# Patient Record
Sex: Female | Born: 1985 | Race: Black or African American | Hispanic: No | Marital: Single | Smoking: Never smoker
Health system: Southern US, Community
[De-identification: ages and names within clinical notes are randomized; demographics above are authoritative.]

## PROBLEM LIST (undated history)

## (undated) ENCOUNTER — Inpatient Hospital Stay (HOSPITAL_COMMUNITY): Payer: Self-pay

## (undated) DIAGNOSIS — Z8619 Personal history of other infectious and parasitic diseases: Secondary | ICD-10-CM

## (undated) DIAGNOSIS — E739 Lactose intolerance, unspecified: Secondary | ICD-10-CM

## (undated) DIAGNOSIS — D649 Anemia, unspecified: Secondary | ICD-10-CM

## (undated) DIAGNOSIS — R87629 Unspecified abnormal cytological findings in specimens from vagina: Secondary | ICD-10-CM

## (undated) DIAGNOSIS — R002 Palpitations: Secondary | ICD-10-CM

## (undated) DIAGNOSIS — F32A Depression, unspecified: Secondary | ICD-10-CM

## (undated) DIAGNOSIS — M549 Dorsalgia, unspecified: Secondary | ICD-10-CM

## (undated) DIAGNOSIS — R87619 Unspecified abnormal cytological findings in specimens from cervix uteri: Secondary | ICD-10-CM

## (undated) DIAGNOSIS — E559 Vitamin D deficiency, unspecified: Secondary | ICD-10-CM

## (undated) DIAGNOSIS — F419 Anxiety disorder, unspecified: Secondary | ICD-10-CM

## (undated) DIAGNOSIS — IMO0002 Reserved for concepts with insufficient information to code with codable children: Secondary | ICD-10-CM

## (undated) DIAGNOSIS — M7989 Other specified soft tissue disorders: Secondary | ICD-10-CM

## (undated) DIAGNOSIS — R0602 Shortness of breath: Secondary | ICD-10-CM

## (undated) HISTORY — DX: Dorsalgia, unspecified: M54.9

## (undated) HISTORY — DX: Palpitations: R00.2

## (undated) HISTORY — DX: Morbid (severe) obesity due to excess calories: E66.01

## (undated) HISTORY — DX: Other specified soft tissue disorders: M79.89

## (undated) HISTORY — DX: Unspecified abnormal cytological findings in specimens from cervix uteri: R87.619

## (undated) HISTORY — DX: Vitamin D deficiency, unspecified: E55.9

## (undated) HISTORY — DX: Anxiety disorder, unspecified: F41.9

## (undated) HISTORY — DX: Depression, unspecified: F32.A

## (undated) HISTORY — PX: NO PAST SURGERIES: SHX2092

## (undated) HISTORY — DX: Personal history of other infectious and parasitic diseases: Z86.19

## (undated) HISTORY — PX: INGUINAL HERNIA PEDIATRIC WITH LAPAROSCOPIC EXAM: SHX5643

## (undated) HISTORY — DX: Lactose intolerance, unspecified: E73.9

## (undated) HISTORY — PX: WISDOM TOOTH EXTRACTION: SHX21

## (undated) HISTORY — DX: Shortness of breath: R06.02

## (undated) HISTORY — DX: Reserved for concepts with insufficient information to code with codable children: IMO0002

---
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
## 2006-02-01 ENCOUNTER — Emergency Department (HOSPITAL_COMMUNITY): Admission: EM | Admit: 2006-02-01 | Discharge: 2006-02-01 | Payer: Self-pay | Admitting: Family Medicine

## 2006-08-11 ENCOUNTER — Emergency Department (HOSPITAL_COMMUNITY): Admission: EM | Admit: 2006-08-11 | Discharge: 2006-08-11 | Payer: Self-pay | Admitting: Family Medicine

## 2007-12-12 ENCOUNTER — Emergency Department (HOSPITAL_COMMUNITY): Admission: EM | Admit: 2007-12-12 | Discharge: 2007-12-12 | Payer: Self-pay | Admitting: Emergency Medicine

## 2008-08-06 ENCOUNTER — Inpatient Hospital Stay (HOSPITAL_COMMUNITY): Admission: AD | Admit: 2008-08-06 | Discharge: 2008-08-06 | Payer: Self-pay | Admitting: Obstetrics and Gynecology

## 2009-08-23 ENCOUNTER — Ambulatory Visit: Payer: Self-pay | Admitting: Obstetrics & Gynecology

## 2010-01-13 NOTE — L&D Delivery Note (Signed)
Delivery Note Started pushing w/ pt around 0015.  Fhr 135-140s at onset.  Pitocin on 14mu.  Pt felt warm at onset of pushing, and did spike temp=100.4 during pushing stage.  Pt received IVF bolus and po Tylenol.  While pushing, fhr w/ intermittent moderate variables, and at times baseline 150s to 160s.  Pushed left and right tilted positions.  Pt w/ moderate rectal pressure while pushing.  Sister and 2 other females present for delivery.  At 1:36 AM a viable female "Elijah" was delivered via Vaginal, Spontaneous Delivery (Presentation: Left Occiput Anterior).  APGAR: 8, 9; weight 8 lb 12 oz (3970 g).  LNC x1 noted at delivery and reduced over shoulders and body w/o difficulty.  Newborn placed immediately on mom's abdomen; slow to cry, but transitioned well w/ drying and stimulation. Cord doubly clamped and cut soon after delivery secondary to anticipating further interventions at the warmer, but not necessary; cut by patient.   Placenta status: Intact, Spontaneous, Schultz.  Cord: 3 vessels with the following complications: None.  Cord pH: n/a End second stage meconium noted as newborn delivered; fluid had been clear during labor. Anesthesia: Epidural  Episiotomy: None Lacerations: 1st degree;Sulcus; bilateral; Rt repaired, left hemostatic; Rt slightly deeper. Suture Repair: 3-0 monocryl Est. Blood Loss (mL): Secondary to mom's fever intrapartally, and bleeding heavy after placenta, given pr cytotec. Mom to postpartum.  Baby to nursery-stable. Pt plans to breastfeed and outpatient circ.   CTO temp closely. Jalesia Loudenslager H 12/24/2010, 3:08 AM

## 2010-04-08 ENCOUNTER — Inpatient Hospital Stay (HOSPITAL_COMMUNITY): Payer: Medicaid Other

## 2010-04-08 ENCOUNTER — Inpatient Hospital Stay (HOSPITAL_COMMUNITY)
Admission: AD | Admit: 2010-04-08 | Discharge: 2010-04-08 | Disposition: A | Discharge status: Home / Self Care | Payer: Medicaid Other | Source: Ambulatory Visit | Attending: Obstetrics & Gynecology | Admitting: Obstetrics & Gynecology

## 2010-04-08 DIAGNOSIS — A499 Bacterial infection, unspecified: Secondary | ICD-10-CM

## 2010-04-08 DIAGNOSIS — R109 Unspecified abdominal pain: Secondary | ICD-10-CM | POA: Insufficient documentation

## 2010-04-08 DIAGNOSIS — O2390 Unspecified genitourinary tract infection in pregnancy, unspecified trimester: Secondary | ICD-10-CM

## 2010-04-08 DIAGNOSIS — N760 Acute vaginitis: Secondary | ICD-10-CM | POA: Insufficient documentation

## 2010-04-08 DIAGNOSIS — B9689 Other specified bacterial agents as the cause of diseases classified elsewhere: Secondary | ICD-10-CM | POA: Insufficient documentation

## 2010-04-08 LAB — CBC
HCT: 38.6 % (ref 36.0–46.0)
Hemoglobin: 12.9 g/dL (ref 12.0–15.0)
MCH: 30.6 pg (ref 26.0–34.0)
MCHC: 33.4 g/dL (ref 30.0–36.0)
MCV: 91.5 fL (ref 78.0–100.0)

## 2010-04-08 LAB — URINALYSIS, ROUTINE W REFLEX MICROSCOPIC
Bilirubin Urine: NEGATIVE
Glucose, UA: NEGATIVE mg/dL
Hgb urine dipstick: NEGATIVE
Ketones, ur: NEGATIVE mg/dL
Nitrite: NEGATIVE
Protein, ur: NEGATIVE mg/dL
Specific Gravity, Urine: 1.02 (ref 1.005–1.030)
Urobilinogen, UA: 4 mg/dL — ABNORMAL HIGH (ref 0.0–1.0)
pH: 7 (ref 5.0–8.0)

## 2010-04-08 LAB — HCG, QUANTITATIVE, PREGNANCY: hCG, Beta Chain, Quant, S: 625 m[IU]/mL — ABNORMAL HIGH (ref <5)

## 2010-04-08 LAB — ABO/RH: ABO/RH(D): A POS

## 2010-04-08 LAB — URINE MICROSCOPIC-ADD ON

## 2010-04-08 LAB — WET PREP, GENITAL

## 2010-04-08 LAB — POCT PREGNANCY, URINE: Preg Test, Ur: POSITIVE

## 2010-04-09 LAB — GC/CHLAMYDIA PROBE AMP, GENITAL
Chlamydia, DNA Probe: NEGATIVE
GC Probe Amp, Genital: NEGATIVE

## 2010-04-10 ENCOUNTER — Inpatient Hospital Stay (HOSPITAL_COMMUNITY)
Admission: AD | Admit: 2010-04-10 | Discharge: 2010-04-10 | Disposition: A | Discharge status: Home / Self Care | Payer: Medicaid Other | Source: Ambulatory Visit | Attending: Obstetrics and Gynecology | Admitting: Obstetrics and Gynecology

## 2010-04-10 ENCOUNTER — Inpatient Hospital Stay (HOSPITAL_COMMUNITY): Payer: Medicaid Other

## 2010-04-10 DIAGNOSIS — O3680X0 Pregnancy with inconclusive fetal viability, not applicable or unspecified: Secondary | ICD-10-CM

## 2010-04-10 DIAGNOSIS — O99891 Other specified diseases and conditions complicating pregnancy: Secondary | ICD-10-CM | POA: Insufficient documentation

## 2010-04-10 LAB — HCG, QUANTITATIVE, PREGNANCY: hCG, Beta Chain, Quant, S: 2380 m[IU]/mL — ABNORMAL HIGH (ref <5)

## 2010-04-17 ENCOUNTER — Encounter (HOSPITAL_COMMUNITY): Payer: Self-pay

## 2010-04-17 ENCOUNTER — Ambulatory Visit (HOSPITAL_COMMUNITY)
Admit: 2010-04-17 | Discharge: 2010-04-17 | Disposition: A | Discharge status: Home / Self Care | Payer: Medicaid Other | Attending: Obstetrics and Gynecology | Admitting: Obstetrics and Gynecology

## 2010-04-17 DIAGNOSIS — Z3689 Encounter for other specified antenatal screening: Secondary | ICD-10-CM | POA: Insufficient documentation

## 2010-04-17 DIAGNOSIS — O99891 Other specified diseases and conditions complicating pregnancy: Secondary | ICD-10-CM | POA: Insufficient documentation

## 2010-04-17 DIAGNOSIS — R109 Unspecified abdominal pain: Secondary | ICD-10-CM | POA: Insufficient documentation

## 2010-04-21 LAB — POCT PREGNANCY, URINE: Preg Test, Ur: NEGATIVE

## 2010-04-25 ENCOUNTER — Inpatient Hospital Stay (HOSPITAL_COMMUNITY)
Admission: AD | Admit: 2010-04-25 | Discharge: 2010-04-25 | Disposition: A | Discharge status: Home / Self Care | Payer: Medicaid Other | Source: Ambulatory Visit | Attending: Obstetrics & Gynecology | Admitting: Obstetrics & Gynecology

## 2010-04-25 DIAGNOSIS — B373 Candidiasis of vulva and vagina: Secondary | ICD-10-CM

## 2010-04-25 DIAGNOSIS — N949 Unspecified condition associated with female genital organs and menstrual cycle: Secondary | ICD-10-CM | POA: Insufficient documentation

## 2010-04-25 DIAGNOSIS — B3731 Acute candidiasis of vulva and vagina: Secondary | ICD-10-CM | POA: Insufficient documentation

## 2010-04-25 DIAGNOSIS — O2390 Unspecified genitourinary tract infection in pregnancy, unspecified trimester: Secondary | ICD-10-CM

## 2010-04-25 LAB — WET PREP, GENITAL
Clue Cells Wet Prep HPF POC: NONE SEEN
Trich, Wet Prep: NONE SEEN

## 2010-05-14 LAB — RUBELLA ANTIBODY, IGM: Rubella: IMMUNE

## 2010-05-14 LAB — HEPATITIS B SURFACE ANTIGEN: Hepatitis B Surface Ag: NEGATIVE

## 2010-05-14 LAB — ANTIBODY SCREEN: Antibody Screen: NEGATIVE

## 2010-05-14 LAB — HIV ANTIBODY (ROUTINE TESTING W REFLEX): HIV: NONREACTIVE

## 2010-08-13 ENCOUNTER — Other Ambulatory Visit (HOSPITAL_COMMUNITY): Payer: Self-pay | Admitting: Obstetrics and Gynecology

## 2010-08-13 DIAGNOSIS — O359XX0 Maternal care for (suspected) fetal abnormality and damage, unspecified, not applicable or unspecified: Secondary | ICD-10-CM

## 2010-08-15 ENCOUNTER — Encounter (HOSPITAL_COMMUNITY): Payer: Self-pay

## 2010-08-15 ENCOUNTER — Ambulatory Visit (HOSPITAL_COMMUNITY)
Admission: RE | Admit: 2010-08-15 | Discharge: 2010-08-15 | Disposition: A | Discharge status: Home / Self Care | Payer: Medicaid Other | Source: Ambulatory Visit | Attending: Obstetrics and Gynecology | Admitting: Obstetrics and Gynecology

## 2010-08-15 VITALS — BP 121/62 | HR 76 | Wt 226.0 lb

## 2010-08-15 DIAGNOSIS — O358XX0 Maternal care for other (suspected) fetal abnormality and damage, not applicable or unspecified: Secondary | ICD-10-CM | POA: Insufficient documentation

## 2010-08-15 DIAGNOSIS — Z363 Encounter for antenatal screening for malformations: Secondary | ICD-10-CM | POA: Insufficient documentation

## 2010-08-15 DIAGNOSIS — O359XX0 Maternal care for (suspected) fetal abnormality and damage, unspecified, not applicable or unspecified: Secondary | ICD-10-CM

## 2010-08-15 DIAGNOSIS — Z1389 Encounter for screening for other disorder: Secondary | ICD-10-CM | POA: Insufficient documentation

## 2010-08-15 NOTE — Progress Notes (Signed)
Report in AS/EPIC; follow-up as needed 

## 2010-10-15 LAB — POCT I-STAT, CHEM 8
BUN: 8
Creatinine, Ser: 1.1
Glucose, Bld: 99
Hemoglobin: 13.9
TCO2: 23

## 2010-10-15 LAB — URINALYSIS, ROUTINE W REFLEX MICROSCOPIC
Bilirubin Urine: NEGATIVE
Glucose, UA: NEGATIVE
Hgb urine dipstick: NEGATIVE
Protein, ur: 30 — AB
Specific Gravity, Urine: 1.027
Urobilinogen, UA: 1

## 2010-10-15 LAB — URINE MICROSCOPIC-ADD ON

## 2010-10-15 LAB — GLUCOSE, CAPILLARY: Glucose-Capillary: 87

## 2010-10-28 LAB — POCT RAPID STREP A: Streptococcus, Group A Screen (Direct): NEGATIVE

## 2010-11-21 ENCOUNTER — Encounter (HOSPITAL_COMMUNITY): Payer: Self-pay | Admitting: *Deleted

## 2010-11-21 ENCOUNTER — Inpatient Hospital Stay (HOSPITAL_COMMUNITY)
Admission: AD | Admit: 2010-11-21 | Discharge: 2010-11-22 | Disposition: A | Discharge status: Home / Self Care | Payer: Medicaid Other | Source: Ambulatory Visit | Attending: Obstetrics and Gynecology | Admitting: Obstetrics and Gynecology

## 2010-11-21 DIAGNOSIS — O99891 Other specified diseases and conditions complicating pregnancy: Secondary | ICD-10-CM | POA: Insufficient documentation

## 2010-11-21 DIAGNOSIS — N949 Unspecified condition associated with female genital organs and menstrual cycle: Secondary | ICD-10-CM | POA: Insufficient documentation

## 2010-11-22 LAB — URINALYSIS, ROUTINE W REFLEX MICROSCOPIC
Bilirubin Urine: NEGATIVE
Glucose, UA: NEGATIVE mg/dL
Ketones, ur: 15 mg/dL — AB
Protein, ur: NEGATIVE mg/dL

## 2010-11-22 LAB — URINE MICROSCOPIC-ADD ON

## 2010-11-22 MED ORDER — NITROFURANTOIN MONOHYD MACRO 100 MG PO CAPS: 100.0000 mg | ORAL_CAPSULE | Freq: Two times a day (BID) | ORAL | Status: AC

## 2010-11-22 MED ORDER — HYDROXYZINE PAMOATE 50 MG PO CAPS: 50.0000 mg | ORAL_CAPSULE | Freq: Four times a day (QID) | ORAL | Status: AC | PRN

## 2010-11-22 NOTE — ED Provider Notes (Signed)
History     Chief Complaint  Patient presents with  . Pelvic Pain   HPI Comments: Pt is a G1P0 at [redacted]w[redacted]d that arrived unannounced with CC of pelvic pain and pressure that was making it difficult to walk. She denies ctx, VB or LOF, +FM. She said she the pain has been getting worse over the last few days. She said she tried to lay down after work, and take some tylenol and she had some relief.  Declines need for pani medication at present. States she does have difficulty sleeping.  She denies HA/N/V or RUQ pain   Pelvic Pain The patient's primary symptoms include pelvic pain. This is a chronic problem. The current episode started in the past 7 days. The problem occurs constantly. The problem has been unchanged. The pain is mild. The problem affects both sides. She is pregnant.    No past medical history on file.  Past Surgical History  Procedure Date  . Hernia repair   . No past surgeries     Family History  Problem Relation Age of Onset  . Hypertension Father   . Diabetes Father     History  Substance Use Topics  . Smoking status: Never Smoker   . Smokeless tobacco: Never Used  . Alcohol Use: No    Allergies:  Allergies  Allergen Reactions  . Latex Itching    Prescriptions prior to admission  Medication Sig Dispense Refill  . acetaminophen (TYLENOL) 500 MG tablet Take 500 mg by mouth every 6 (six) hours as needed. For pain       . prenatal vitamin w/FE, FA (PRENATAL 1 + 1) 27-1 MG TABS Take 1 tablet by mouth daily.        Marland Kitchen DISCONTD: PRENATAL VITAMINS PO Take by mouth.          Review of Systems  Genitourinary: Positive for pelvic pain.  All other systems reviewed and are negative.   Physical Exam   Blood pressure 112/53, pulse 82, temperature 98.7 F (37.1 C), temperature source Oral, resp. rate 20, height 5\' 7"  (1.702 m), weight 108.863 kg (240 lb), last menstrual period 03/09/2010.  Physical Exam  Nursing note and vitals reviewed. Constitutional: She is  oriented to person, place, and time. She appears well-developed and well-nourished.  Neck: Normal range of motion.  Cardiovascular: Normal rate.   Respiratory: Effort normal.  GI: Soft.  Genitourinary: Vagina normal.       Cx=1/th/high ballottable   Musculoskeletal: Normal range of motion. She exhibits edema.       1+ to 2+ ankle and pedal, hands slightly swolen  Neurological: She is alert and oriented to person, place, and time. She has normal reflexes.  Skin: Skin is warm and dry.  Psychiatric: She has a normal mood and affect. Her behavior is normal.   FHR 140 Mod variability Cat I toco irritability rare ctx   MAU Course  Procedures    Assessment and Plan  IUP at [redacted]w[redacted]d No labor FHR reassuring Normal preg discomfort Initially elevated BP, repeat normal - no PIH sx's  D/C home,  RX for vistaril PO for rest RTO as scheduled  Rhonda Barron M 11/22/2010, 12:17 AM

## 2010-12-19 ENCOUNTER — Telehealth (HOSPITAL_COMMUNITY): Payer: Self-pay | Admitting: *Deleted

## 2010-12-19 ENCOUNTER — Encounter (HOSPITAL_COMMUNITY): Payer: Self-pay | Admitting: *Deleted

## 2010-12-19 NOTE — Telephone Encounter (Signed)
Preadmission screen  

## 2010-12-21 ENCOUNTER — Encounter (HOSPITAL_COMMUNITY): Payer: Self-pay | Admitting: Pharmacist

## 2010-12-22 ENCOUNTER — Encounter (HOSPITAL_COMMUNITY): Payer: Self-pay

## 2010-12-22 ENCOUNTER — Inpatient Hospital Stay (HOSPITAL_COMMUNITY)
Admission: RE | Admit: 2010-12-22 | Discharge: 2010-12-26 | DRG: 774 | Disposition: A | Discharge status: Home / Self Care | Payer: Medicaid Other | Source: Ambulatory Visit | Attending: Obstetrics and Gynecology | Admitting: Obstetrics and Gynecology

## 2010-12-22 DIAGNOSIS — A630 Anogenital (venereal) warts: Secondary | ICD-10-CM | POA: Diagnosis present

## 2010-12-22 DIAGNOSIS — O480 Post-term pregnancy: Principal | ICD-10-CM | POA: Diagnosis present

## 2010-12-22 DIAGNOSIS — O98519 Other viral diseases complicating pregnancy, unspecified trimester: Secondary | ICD-10-CM | POA: Diagnosis present

## 2010-12-22 LAB — CBC
MCV: 90.6 fL (ref 78.0–100.0)
Platelets: 191 10*3/uL (ref 150–400)
RBC: 3.93 MIL/uL (ref 3.87–5.11)
RDW: 13.5 % (ref 11.5–15.5)
WBC: 7.3 10*3/uL (ref 4.0–10.5)

## 2010-12-22 LAB — RPR: RPR Ser Ql: NONREACTIVE

## 2010-12-22 MED ORDER — ONDANSETRON HCL 4 MG/2ML IJ SOLN
4.0000 mg | Freq: Four times a day (QID) | INTRAMUSCULAR | Status: DC | PRN
  Administered 2010-12-23: 4 mg via INTRAVENOUS
  Filled 2010-12-22: qty 2

## 2010-12-22 MED ORDER — BUTORPHANOL TARTRATE 2 MG/ML IJ SOLN
1.0000 mg | INTRAMUSCULAR | Status: DC | PRN
  Administered 2010-12-23: 1 mg via INTRAVENOUS
  Filled 2010-12-22 (×2): qty 1

## 2010-12-22 MED ORDER — MISOPROSTOL 25 MCG QUARTER TABLET
25.0000 ug | ORAL_TABLET | ORAL | Status: DC | PRN
  Administered 2010-12-22 (×3): 25 ug via VAGINAL
  Filled 2010-12-22 (×4): qty 0.25

## 2010-12-22 MED ORDER — HYDROXYZINE HCL 50 MG/ML IM SOLN: 50.0000 mg | Freq: Four times a day (QID) | INTRAMUSCULAR | Status: DC | PRN

## 2010-12-22 MED ORDER — SODIUM CHLORIDE 0.9 % IJ SOLN
3.0000 mL | Freq: Two times a day (BID) | INTRAMUSCULAR | Status: DC
  Administered 2010-12-22: 3 mL via INTRAVENOUS

## 2010-12-22 MED ORDER — CITRIC ACID-SODIUM CITRATE 334-500 MG/5ML PO SOLN
30.0000 mL | ORAL | Status: DC | PRN
  Filled 2010-12-22: qty 15

## 2010-12-22 MED ORDER — LACTATED RINGERS IV SOLN
500.0000 mL | INTRAVENOUS | Status: DC | PRN
  Administered 2010-12-23 (×3): 1000 mL via INTRAVENOUS

## 2010-12-22 MED ORDER — HYDROXYZINE HCL 50 MG PO TABS: 50.0000 mg | ORAL_TABLET | Freq: Four times a day (QID) | ORAL | Status: DC | PRN

## 2010-12-22 MED ORDER — ZOLPIDEM TARTRATE 10 MG PO TABS: 10.0000 mg | ORAL_TABLET | Freq: Every evening | ORAL | Status: DC | PRN

## 2010-12-22 MED ORDER — OXYCODONE-ACETAMINOPHEN 5-325 MG PO TABS: 2.0000 | ORAL_TABLET | ORAL | Status: DC | PRN

## 2010-12-22 MED ORDER — SODIUM CHLORIDE 0.9 % IV SOLN: 250.0000 mL | INTRAVENOUS | Status: DC | PRN

## 2010-12-22 MED ORDER — IBUPROFEN 600 MG PO TABS: 600.0000 mg | ORAL_TABLET | Freq: Four times a day (QID) | ORAL | Status: DC | PRN

## 2010-12-22 MED ORDER — DINOPROSTONE 10 MG VA INST
10.0000 mg | VAGINAL_INSERT | Freq: Once | VAGINAL | Status: AC
  Administered 2010-12-22: 10 mg via VAGINAL
  Filled 2010-12-22: qty 1

## 2010-12-22 MED ORDER — SODIUM CHLORIDE 0.9 % IJ SOLN: 3.0000 mL | INTRAMUSCULAR | Status: DC | PRN

## 2010-12-22 MED ORDER — LIDOCAINE HCL (PF) 1 % IJ SOLN
30.0000 mL | INTRAMUSCULAR | Status: DC | PRN
  Filled 2010-12-22: qty 30

## 2010-12-22 MED ORDER — ACETAMINOPHEN 325 MG PO TABS
650.0000 mg | ORAL_TABLET | ORAL | Status: DC | PRN
  Administered 2010-12-23 – 2010-12-24 (×2): 650 mg via ORAL
  Filled 2010-12-22 (×2): qty 2

## 2010-12-22 MED ORDER — OXYTOCIN 20 UNITS IN LACTATED RINGERS INFUSION - SIMPLE
125.0000 mL/h | Freq: Once | INTRAVENOUS | Status: AC
  Administered 2010-12-24: 999 mL/h via INTRAVENOUS

## 2010-12-22 MED ORDER — FLEET ENEMA 7-19 GM/118ML RE ENEM: 1.0000 | ENEMA | RECTAL | Status: DC | PRN

## 2010-12-22 MED ORDER — OXYTOCIN 20 UNITS IN LACTATED RINGERS INFUSION - SIMPLE
1.0000 m[IU]/min | INTRAVENOUS | Status: DC
  Administered 2010-12-23: 1 m[IU]/min via INTRAVENOUS
  Administered 2010-12-23: 12 m[IU]/min via INTRAVENOUS
  Filled 2010-12-22: qty 1000

## 2010-12-22 MED ORDER — OXYTOCIN BOLUS FROM INFUSION
500.0000 mL | Freq: Once | INTRAVENOUS | Status: DC
  Filled 2010-12-22: qty 500

## 2010-12-22 MED ORDER — TERBUTALINE SULFATE 1 MG/ML IJ SOLN: 0.2500 mg | Freq: Once | INTRAMUSCULAR | Status: AC | PRN

## 2010-12-22 NOTE — Progress Notes (Signed)
Subjective: Placed pt's 3rd cytotec pv around 1630.  No c/o's except "cramping" sister and another female visitor remain at bedside.  No LOF or VB.  GFM.  Objective: BP 136/71  Pulse 75  Temp(Src) 98.2 F (36.8 C) (Oral)  Resp 18  Ht 5\' 7"  (1.702 m)  Wt 113.399 kg (250 lb)  BMI 39.16 kg/m2  LMP 03/09/2010      FHT:  FHR: 145 bpm, variability: moderate,  accelerations:  Present,  decelerations:  Absent UC:   Uterine irratability SVE:   Dilation: 1.5 (cervix anterior and to the left) Effacement (%): 60 Station: -2 Exam by:: H.Juniel Groene,cnm  Labs: Lab Results  Component Value Date   WBC 7.3 12/22/2010   HGB 12.4 12/22/2010   HCT 35.6* 12/22/2010   MCV 90.6 12/22/2010   PLT 191 12/22/2010    Assessment / Plan: Induction at 41 weeks  Labor: s/p 3rd dose pv cytotec at 1630 Preeclampsia:  no signs or symptoms of toxicity Fetal Wellbeing:  Category I Pain Control:  Labor support without medications I/D:  n/a Anticipated MOD:  NSVD Will reassess at 2030; will switch to cervidil overnight at that point if no active labor. Coleman Kalas H 12/22/2010, 4:57 PM

## 2010-12-22 NOTE — Progress Notes (Signed)
Monitors off. 

## 2010-12-22 NOTE — Progress Notes (Signed)
Subjective: Pt w/o complaints; cramping continues, mild.  Had supper.  No LOF or VB.  GFM.  S/p 3 does of pv cytotec.  Objective: BP 121/74  Pulse 88  Temp(Src) 97.7 F (36.5 C) (Axillary)  Resp 18  Ht 5\' 7"  (1.702 m)  Wt 113.399 kg (250 lb)  BMI 39.16 kg/m2  LMP 03/09/2010   Total I/O In: 2 [I.V.:2] Out: -   FHT:  FHR: 150 bpm, variability: moderate,  accelerations:  Present,  decelerations:  Absent UC:   1-3 minutes, primarily UI SVE:   Dilation: 1.5 Effacement (%): 70;80 Station: -2 Exam by:: Gabriana Wilmott  Labs: Lab Results  Component Value Date   WBC 7.3 12/22/2010   HGB 12.4 12/22/2010   HCT 35.6* 12/22/2010   MCV 90.6 12/22/2010   PLT 191 12/22/2010    Assessment / Plan: Induction at 41 weeks; s/p 3 doses pv cytotec  Labor: No dilatation progress, but cx has thinned some Preeclampsia:  no signs or symptoms of toxicity Fetal Wellbeing:  Category I Pain Control:  Labor support without medications I/D:  n/a Anticipated MOD:  NSVD Cervidil placed at 2035.  Will watch for 2 hr, and then do NST q4hrs overnight.  Rec'd rest, Ambien prn.  Will start Pitocin in AM.  C/w MD prn. Yousra Ivens H 12/22/2010, 11:47 PM

## 2010-12-23 ENCOUNTER — Encounter (HOSPITAL_COMMUNITY): Payer: Self-pay | Admitting: Anesthesiology

## 2010-12-23 ENCOUNTER — Inpatient Hospital Stay (HOSPITAL_COMMUNITY): Payer: Medicaid Other | Admitting: Anesthesiology

## 2010-12-23 DIAGNOSIS — Z87898 Personal history of other specified conditions: Secondary | ICD-10-CM | POA: Insufficient documentation

## 2010-12-23 DIAGNOSIS — O480 Post-term pregnancy: Secondary | ICD-10-CM | POA: Diagnosis present

## 2010-12-23 DIAGNOSIS — E669 Obesity, unspecified: Secondary | ICD-10-CM | POA: Insufficient documentation

## 2010-12-23 MED ORDER — FENTANYL 2.5 MCG/ML BUPIVACAINE 1/10 % EPIDURAL INFUSION (WH - ANES)
14.0000 mL/h | INTRAMUSCULAR | Status: DC
  Administered 2010-12-23 (×2): 14 mL/h via EPIDURAL
  Filled 2010-12-23 (×3): qty 60

## 2010-12-23 MED ORDER — FENTANYL 2.5 MCG/ML BUPIVACAINE 1/10 % EPIDURAL INFUSION (WH - ANES)
INTRAMUSCULAR | Status: DC | PRN
  Administered 2010-12-23: 14 mL/h via EPIDURAL

## 2010-12-23 MED ORDER — EPHEDRINE 5 MG/ML INJ: 10.0000 mg | INTRAVENOUS | Status: DC | PRN

## 2010-12-23 MED ORDER — LIDOCAINE HCL 1.5 % IJ SOLN
INTRAMUSCULAR | Status: DC | PRN
  Administered 2010-12-23 (×2): 4 mL via EPIDURAL

## 2010-12-23 MED ORDER — DIPHENHYDRAMINE HCL 50 MG/ML IJ SOLN: 12.5000 mg | INTRAMUSCULAR | Status: DC | PRN

## 2010-12-23 MED ORDER — EPHEDRINE 5 MG/ML INJ
10.0000 mg | INTRAVENOUS | Status: DC | PRN
  Filled 2010-12-23: qty 4

## 2010-12-23 MED ORDER — PHENYLEPHRINE 40 MCG/ML (10ML) SYRINGE FOR IV PUSH (FOR BLOOD PRESSURE SUPPORT): 80.0000 ug | PREFILLED_SYRINGE | INTRAVENOUS | Status: DC | PRN

## 2010-12-23 MED ORDER — LACTATED RINGERS IV SOLN: 500.0000 mL | Freq: Once | INTRAVENOUS | Status: DC

## 2010-12-23 MED ORDER — PHENYLEPHRINE 40 MCG/ML (10ML) SYRINGE FOR IV PUSH (FOR BLOOD PRESSURE SUPPORT)
80.0000 ug | PREFILLED_SYRINGE | INTRAVENOUS | Status: DC | PRN
  Filled 2010-12-23: qty 5

## 2010-12-23 NOTE — H&P (Signed)
Rhonda Barron is a 25 y.o. black female presenting at 41 weeks for term induction of labor.  Pt w/o c/o's this morning.  Discussed induction agents and risk of c/s with induction; pt desires to proceed despite increased risk w/ induction and primagravida.  Accompanied to [facility] by her sister.  No LOF, VB, or abnl d/c.  GFM.  Occ'l ctx.  No UTI or PIH s/s.  Pt did not receive flu vaccine during pregnancy. Onset of care at 9.2 weeks.  Most recent u/s at 38 weeks and growth in 78%.   Maternal Medical History:  Reason for admission: Post term induction of labor  Contractions: Frequency: rare.   Perceived severity is mild.    Fetal activity: Perceived fetal activity is normal.   Last perceived fetal movement was within the past hour.    Prenatal complications: 1.  H/o chlamydia 2.  LSIL at NOB; repeat at 28 weeks ASCUS w/ HR HPV 3.  Treatment of genital warts during pregnancy w/ TCA (several times during 2nd trimester) 4.  MFM referral for detailed anatomy after initial anatomy scan showed questionable anechoic area of fetal bowel; Scan by Dr. Tona Sensing revealed nml findings and no fetal anomalies 5.  Obese    OB History    Grav Para Term Preterm Abortions TAB SAB Ect Mult Living   1 0 0 0 0 0 0 0 0 0      Past Medical History  Diagnosis Date  . History of chlamydia   . Abnormal Pap smear    Past Surgical History  Procedure Date  . Hernia repair   . No past surgeries    Family History: family history includes Asthma in her brother; Diabetes in her father; Hypertension in her father; and Stroke in her paternal uncle.  There is no history of Anesthesia problems, and Hypotension, and Pseudochol deficiency, and Malignant hyperthermia, . Social History:  reports that she has never smoked. She has never used smokeless tobacco. She reports that she does not drink alcohol or use illicit drugs.  Review of Systems  Constitutional: Negative.   HENT: Negative.   Eyes: Negative.   Respiratory:  Negative.   Cardiovascular: Negative.   Gastrointestinal: Negative.   Genitourinary: Negative.   Skin: Negative.   Neurological: Negative.    Blood pressure 132/81, pulse 77, temperature 98.2 F (36.8 C), temperature source Oral, resp. rate 20, height 5\' 7"  (1.702 m), weight 113.399 kg (250 lb), last menstrual period 03/09/2010. Maternal Exam:  Uterine Assessment: Contraction strength is mild.  Contraction frequency is rare.   Abdomen: Patient reports no abdominal tenderness. Fundal height is 8-8.5 lbs.   Fetal presentation: vertex  Introitus: not evaluated.   Pelvis: adequate for delivery.   Cervix: Cervix evaluated by digital exam.     Fetal Exam Fetal Monitor Review: Mode: ultrasound.   Baseline rate: 145.  Variability: moderate (6-25 bpm).   Pattern: accelerations present and no decelerations.    Fetal State Assessment: Category I - tracings are normal.     Physical Exam  Constitutional: She is oriented to person, place, and time. She appears well-developed and well-nourished. No distress.  Cardiovascular: Normal rate and regular rhythm.   Respiratory: Effort normal and breath sounds normal.  GI: Soft. Bowel sounds are normal.       gravid  Genitourinary:       Cx:  Loose 1cm/50/-2, anterior, difficulty reaching secondary to position just behind pubic bone  Musculoskeletal: She exhibits edema.  2+ BLE pitting edema; DTR's 1+ bilaterally; no clonus  Neurological: She is alert and oriented to person, place, and time. She has normal reflexes.  Skin: Skin is warm and dry.  Psychiatric: She has a normal mood and affect. Her behavior is normal. Thought content normal.    Prenatal labs: ABO, Rh: --/--/A POS (03/26 1100) Antibody: Negative (05/01 0000) Rubella: Immune (05/01 0000) RPR: NON REACTIVE (12/09 0800)  HBsAg: Negative (05/01 0000)  HIV: Non-reactive (05/01 0000)  GBS: Negative (11/07 0000)  1st trimester screen and AFP Negative  Assessment/Plan: 1.   IUP at 41 weeks 2.  Nonfavorable cx, but post-term and desires IOL 3.  Cat I FHT 4.  GBS neg 5.  H/o abnl pap x 2 in pregnancy 6.  Condylomata treatments during 2nd trimester  1.  Admit to BS w/ Routine CNM orders 2.  Will proceed w/ cytotec pv q 4hr during day, and if no labor, consider continuing overnight, or switch to cervidil 3.  Regular diet until in active labor 4.  NST q 2 hrs  5.  C/w MD prn   STEELMAN,CANDICE H 12/23/2010, 12:31 PM

## 2010-12-23 NOTE — Anesthesia Preprocedure Evaluation (Signed)
Anesthesia Evaluation  Patient identified by MRN, date of birth, ID band Patient awake    Reviewed: Allergy & Precautions, H&P , Patient's Chart, lab work & pertinent test results  Airway Mallampati: III TM Distance: >3 FB Neck ROM: full    Dental No notable dental hx. (+) Teeth Intact   Pulmonary neg pulmonary ROS,  clear to auscultation  Pulmonary exam normal       Cardiovascular neg cardio ROS regular Normal    Neuro/Psych Negative Neurological ROS  Negative Psych ROS   GI/Hepatic negative GI ROS, Neg liver ROS,   Endo/Other  Negative Endocrine ROSMorbid obesity  Renal/GU negative Renal ROS  Genitourinary negative   Musculoskeletal   Abdominal Normal abdominal exam  (+)   Peds  Hematology negative hematology ROS (+)   Anesthesia Other Findings   Reproductive/Obstetrics (+) Pregnancy                           Anesthesia Physical Anesthesia Plan  ASA: III  Anesthesia Plan: Epidural   Post-op Pain Management:    Induction:   Airway Management Planned:   Additional Equipment:   Intra-op Plan:   Post-operative Plan:   Informed Consent: I have reviewed the patients History and Physical, chart, labs and discussed the procedure including the risks, benefits and alternatives for the proposed anesthesia with the patient or authorized representative who has indicated his/her understanding and acceptance.     Plan Discussed with: Anesthesiologist and Surgeon  Anesthesia Plan Comments:         Anesthesia Quick Evaluation  

## 2010-12-23 NOTE — Progress Notes (Signed)
  Subjective: Cramping with Cervidil--removed without difficulty.  Objective: BP 133/73  Pulse 66  Temp(Src) 98.3 F (36.8 C) (Oral)  Resp 18  Ht 5\' 7"  (1.702 m)  Wt 113.399 kg (250 lb)  BMI 39.16 kg/m2  LMP 03/09/2010 I/O last 3 completed shifts: In: 2 [I.V.:2] Out: -     FHT:  FHR: 140 bpm, variability: moderate,  accelerations:  Present,  decelerations:  Absent UC:   irregular, every 4-6 minutes, mild SVE:   Dilation: 1.5 Effacement (%): 70 Station: -2 Exam by:: V. Arion Shankles CNM  Labs: Lab Results  Component Value Date   WBC 7.3 12/22/2010   HGB 12.4 12/22/2010   HCT 35.6* 12/22/2010   MCV 90.6 12/22/2010   PLT 191 12/22/2010    Assessment / Plan: Post-dates, cervical ripening last 24 hours Start pitocin per low dose protocol Pain med prn.   Nigel Bridgeman 12/23/2010, 9:42 AM

## 2010-12-23 NOTE — Progress Notes (Signed)
  Subjective: Comfortable, some rectal pressure.  Epidural placed at 4:43pm.  Had SROM at 4:15 pm, clear fluid, with RN exam--5 cm then.  Objective: BP 125/75  Pulse 75  Temp(Src) 98 F (36.7 C) (Oral)  Resp 18  Ht 5\' 7"  (1.702 m)  Wt 113.399 kg (250 lb)  BMI 39.16 kg/m2  SpO2 100%  LMP 03/09/2010 I/O last 3 completed shifts: In: 2 [I.V.:2] Out: -     FHT:  Category 1 UC:   irregular, every 2-4 minutes SVE:   Dilation: 7.5 Effacement (%): 100 Station: 0 Exam by:: Manfred Arch CNM Slightly more cervix on right than left. Leaking clear fluid Pitocin on 13 mu/min   Assessment / Plan: Induction of labor due to postterm,  progressing well on pitocin Continue use of pitocin, position to facilitate rotation and descent. Await increased pressure/urge to push.  Nigel Bridgeman 12/23/2010, 7:04 PM

## 2010-12-23 NOTE — Progress Notes (Signed)
Subjective: Pt comfortable.  No c/o's.  No rectal pressure.  Fm at bedside.  Pitocin on 31mu/min.  Objective: BP 126/74  Pulse 81  Temp(Src) 98 F (36.7 C) (Oral)  Resp 18  Ht 5\' 7"  (1.702 m)  Wt 113.399 kg (250 lb)  BMI 39.16 kg/m2  SpO2 100%  LMP 03/09/2010 I/O last 3 completed shifts: In: 2 [I.V.:2] Out: -     FHT:  FHR: 135 bpm, variability: moderate,  accelerations:  Present,  decelerations:  Present occ'l mild brief variable w/ ctx UC:   regular, every 2-5 minutes SVE:   Dilation: 8.5 Effacement (%): 100 Station: -1 Exam by:: H. Valeree Leidy CNM Attempted to insert IUPC, but unable to thread.  Head a little asynclitic. Pt reposition to Rt side.  Assessment / Plan: Induction of labor due to term with favorable cervix,  progressing well on pitocin  Labor: Transition Preeclampsia:  no signs or symptoms of toxicity Fetal Wellbeing:  Category I Pain Control:  Epidural I/D:  n/a Anticipated MOD:  NSVD Will continue to titrate pitocin as needed.  Will push w/ urge.  C/w md prn. Aiden Rao H 12/23/2010, 7:50 PM

## 2010-12-23 NOTE — Anesthesia Procedure Notes (Signed)
Epidural Patient location during procedure: OB Start time: 12/23/2010 4:53 PM  Staffing Anesthesiologist: Brittiny Levitz A. Performed by: anesthesiologist   Preanesthetic Checklist Completed: patient identified, site marked, surgical consent, pre-op evaluation, timeout performed, IV checked, risks and benefits discussed and monitors and equipment checked  Epidural Patient position: sitting Prep: site prepped and draped and DuraPrep Patient monitoring: continuous pulse ox and blood pressure Approach: midline Injection technique: LOR air  Needle:  Needle type: Tuohy  Needle gauge: 17 G Needle length: 9 cm Needle insertion depth: 8 cm Catheter type: closed end flexible Catheter size: 19 Gauge Catheter at skin depth: 14 cm Test dose: negative and 1.5% lidocaine  Assessment Events: blood not aspirated, injection not painful, no injection resistance, negative IV test and no paresthesia  Additional Notes Patient is more comfortable after epidural dosed. Please see RN's note for documentation of vital signs and FHR which are stable.

## 2010-12-24 ENCOUNTER — Encounter (HOSPITAL_COMMUNITY): Payer: Self-pay

## 2010-12-24 LAB — CBC
Hemoglobin: 10.6 g/dL — ABNORMAL LOW (ref 12.0–15.0)
Platelets: 167 10*3/uL (ref 150–400)
RBC: 3.42 MIL/uL — ABNORMAL LOW (ref 3.87–5.11)
WBC: 14.5 10*3/uL — ABNORMAL HIGH (ref 4.0–10.5)

## 2010-12-24 MED ORDER — ONDANSETRON HCL 4 MG PO TABS: 4.0000 mg | ORAL_TABLET | ORAL | Status: DC | PRN

## 2010-12-24 MED ORDER — MISOPROSTOL 200 MCG PO TABS
ORAL_TABLET | ORAL | Status: AC
  Filled 2010-12-24: qty 5

## 2010-12-24 MED ORDER — ZOLPIDEM TARTRATE 5 MG PO TABS: 5.0000 mg | ORAL_TABLET | Freq: Every evening | ORAL | Status: DC | PRN

## 2010-12-24 MED ORDER — PRENATAL PLUS 27-1 MG PO TABS
1.0000 | ORAL_TABLET | Freq: Every day | ORAL | Status: DC
  Administered 2010-12-24 – 2010-12-26 (×3): 1 via ORAL
  Filled 2010-12-24 (×3): qty 1

## 2010-12-24 MED ORDER — BENZOCAINE-MENTHOL 20-0.5 % EX AERO: 1.0000 "application " | INHALATION_SPRAY | CUTANEOUS | Status: DC | PRN

## 2010-12-24 MED ORDER — WITCH HAZEL-GLYCERIN EX PADS: 1.0000 "application " | MEDICATED_PAD | CUTANEOUS | Status: DC | PRN

## 2010-12-24 MED ORDER — POLYSACCHARIDE IRON 150 MG PO CAPS
150.0000 mg | ORAL_CAPSULE | Freq: Two times a day (BID) | ORAL | Status: DC
  Administered 2010-12-24 – 2010-12-26 (×5): 150 mg via ORAL
  Filled 2010-12-24 (×5): qty 1

## 2010-12-24 MED ORDER — ONDANSETRON HCL 4 MG/2ML IJ SOLN: 4.0000 mg | INTRAMUSCULAR | Status: DC | PRN

## 2010-12-24 MED ORDER — DIBUCAINE 1 % RE OINT: 1.0000 "application " | TOPICAL_OINTMENT | RECTAL | Status: DC | PRN

## 2010-12-24 MED ORDER — METHYLERGONOVINE MALEATE 0.2 MG/ML IJ SOLN: 0.2000 mg | INTRAMUSCULAR | Status: DC | PRN

## 2010-12-24 MED ORDER — SENNOSIDES-DOCUSATE SODIUM 8.6-50 MG PO TABS
2.0000 | ORAL_TABLET | Freq: Every day | ORAL | Status: DC
  Administered 2010-12-24 – 2010-12-25 (×2): 2 via ORAL

## 2010-12-24 MED ORDER — MISOPROSTOL 200 MCG PO TABS
1000.0000 ug | ORAL_TABLET | Freq: Once | ORAL | Status: AC
  Administered 2010-12-24: 1000 ug via RECTAL

## 2010-12-24 MED ORDER — METHYLERGONOVINE MALEATE 0.2 MG PO TABS: 0.2000 mg | ORAL_TABLET | ORAL | Status: DC | PRN

## 2010-12-24 MED ORDER — OXYCODONE-ACETAMINOPHEN 5-325 MG PO TABS: 1.0000 | ORAL_TABLET | ORAL | Status: DC | PRN

## 2010-12-24 MED ORDER — MEDROXYPROGESTERONE ACETATE 150 MG/ML IM SUSP: 150.0000 mg | INTRAMUSCULAR | Status: DC | PRN

## 2010-12-24 MED ORDER — LANOLIN HYDROUS EX OINT: TOPICAL_OINTMENT | CUTANEOUS | Status: DC | PRN

## 2010-12-24 MED ORDER — MAGNESIUM HYDROXIDE 400 MG/5ML PO SUSP: 30.0000 mL | ORAL | Status: DC | PRN

## 2010-12-24 MED ORDER — TETANUS-DIPHTH-ACELL PERTUSSIS 5-2.5-18.5 LF-MCG/0.5 IM SUSP: 0.5000 mL | Freq: Once | INTRAMUSCULAR | Status: DC

## 2010-12-24 MED ORDER — IBUPROFEN 600 MG PO TABS
600.0000 mg | ORAL_TABLET | Freq: Four times a day (QID) | ORAL | Status: DC
  Administered 2010-12-24 – 2010-12-26 (×9): 600 mg via ORAL
  Filled 2010-12-24 (×9): qty 1

## 2010-12-24 MED ORDER — SIMETHICONE 80 MG PO CHEW: 80.0000 mg | CHEWABLE_TABLET | ORAL | Status: DC | PRN

## 2010-12-24 MED ORDER — DIPHENHYDRAMINE HCL 25 MG PO CAPS
25.0000 mg | ORAL_CAPSULE | Freq: Four times a day (QID) | ORAL | Status: DC | PRN
Start: 2010-12-24 — End: 2010-12-26

## 2010-12-24 NOTE — Progress Notes (Signed)
Post Partum Day 0 Subjective: no complaints.  Very tired.  Family at bedside.  Breastfeeding.  FOB just asked about DNA testing--discussed this is offered through private agencies, and family member advised him he can go through court system.  Objective: Blood pressure 96/59, pulse 89, temperature 98.8 F (37.1 C), temperature source Oral, resp. rate 18, height 5\' 7"  (1.702 m), weight 113.399 kg (250 lb), last menstrual period 03/09/2010, SpO2 98.00%, unknown if currently breastfeeding.  Tmax 100.4 just before delivery--last elevation since delivery was 99.4 at 4 am (gradually defervescing)  Physical Exam:  General:tired, but alert Lochia: appropriate Uterine Fundus: firm Incision: healing well DVT Evaluation: No evidence of DVT seen on physical exam. Negative Homan's sign.   Basename 12/24/10 0525 12/22/10 0800  HGB 10.6* 12.4  HCT 30.9* 35.6*   Results for orders placed during the hospital encounter of 12/22/10 (from the past 24 hour(s))  CBC     Status: Abnormal   Collection Time   12/24/10  5:25 AM      Component Value Range   WBC 14.5 (*) 4.0 - 10.5 (K/uL)   RBC 3.42 (*) 3.87 - 5.11 (MIL/uL)   Hemoglobin 10.6 (*) 12.0 - 15.0 (g/dL)   HCT 04.5 (*) 40.9 - 46.0 (%)   MCV 90.4  78.0 - 100.0 (fL)   MCH 31.0  26.0 - 34.0 (pg)   MCHC 34.3  30.0 - 36.0 (g/dL)   RDW 81.1  91.4 - 78.2 (%)   Platelets 167  150 - 400 (K/uL)    Assessment/Plan: Continue current care. May want to consider discharge tomorrow. Undecided regarding birth control--options reviewed.   LOS: 2 days   Rhonda Barron 12/24/2010, 10:08 AM

## 2010-12-24 NOTE — Anesthesia Postprocedure Evaluation (Signed)
  Anesthesia Post-op Note  Patient: Rhonda Barron  Procedure(s) Performed: * No procedures listed *  Patient Location: Mother/Baby  Anesthesia Type: Epidural  Level of Consciousness: awake, alert  and oriented  Airway and Oxygen Therapy: Patient Spontanous Breathing  Post-op Pain: none  Post-op Assessment: Post-op Vital signs reviewed and Patient's Cardiovascular Status Stable  Post-op Vital Signs: Reviewed and stable  Complications: No apparent anesthesia complications

## 2010-12-24 NOTE — Progress Notes (Signed)
UR Chart review completed.  

## 2010-12-25 NOTE — Progress Notes (Signed)
Patient ID: Rhonda Barron, female   DOB: 11-Apr-1985, 25 y.o.   MRN: [id number] Post Partum Day 1 Subjective: no complaints, up ad lib without syncope, voiding, tolerating PO, + flatus  Pain well controlled with po meds Bottlefeeding Mood stable, bonding well   Objective: Blood pressure 115/73, pulse 82, temperature 98.3 F (36.8 C), temperature source Oral, resp. rate 19, height 5\' 7"  (1.702 m), weight 113.399 kg (250 lb), last menstrual period 03/09/2010, SpO2 100.00%, unknown if currently breastfeeding.  Physical Exam:  General: alert, fatigued and no distress Lungs: CTAB Heart: RRR Breasts: soft, intact Lochia: appropriate Uterine Fundus: firm Perineum: WNL DVT Evaluation: No evidence of DVT seen on physical exam. Negative Homan's sign. Calf/Ankle edema is present.+! BLE   Basename 12/24/10 0525  HGB 10.6*  HCT 30.9*    Assessment/Plan: Plan for discharge tomorrow and Contraception undecided, considering depo or nexplanon      LOS: 3 days   Kriste Broman M 12/25/2010, 2:06 PM

## 2010-12-26 MED ORDER — IBUPROFEN 600 MG PO TABS: 600.0000 mg | ORAL_TABLET | Freq: Four times a day (QID) | ORAL | Status: AC | PRN

## 2010-12-26 NOTE — Discharge Summary (Signed)
Obstetric Discharge Summary Reason for Admission: induction of labor for postdates Prenatal Procedures:ultrasound Intrapartum Procedures: spontaneous vaginal delivery Postpartum Procedures: none Complications-Operative and Postpartum: bilateral sulcus laterations Hemoglobin  Date Value Range Status  12/24/2010 10.6* 12.0-15.0 (g/dL) Final     HCT  Date Value Range Status  12/24/2010 30.9* 36.0-46.0 (%) Final  Hospital Course: Admitted for cytotec postdates induction GBS-. Cytotec x 24 hours then pitocin. T 100.4 during labor. Delivery was performed by French Ana without complication. Patient and baby tolerated the procedure without difficulty, with bilateral sulcus laceration noted. Infant to FTN. Mother and infant then had an uncomplicated postpartum course, with breastfeeding going well. Mom's physical exam was WNL, and she was discharged home in stable condition. Contraception plan was nexplanon.  She received adequate benefit from po pain medications.      Discharge Diagnoses: Post-date pregnancy SVD  Discharge Information: Date: 12/26/2010 Activity: pelvic rest Diet: routine Medications: Ibuprofen Condition: stable Instructions: refer to practice specific booklet Discharge to: home Contraception plans nexplanon Follow-up Information    Follow up with [facility] in 5 weeks. (call to schedule circumcision)          Newborn Data: Live born female  Birth Weight: 8 lb 12 oz (3970 g) APGAR: 8, 9  Home with mother.  Rhonda Barron 12/26/2010, 8:14 AM

## 2012-01-01 ENCOUNTER — Encounter (HOSPITAL_COMMUNITY): Payer: Self-pay

## 2012-01-01 ENCOUNTER — Emergency Department (HOSPITAL_COMMUNITY): Payer: Self-pay

## 2012-01-01 ENCOUNTER — Emergency Department (HOSPITAL_COMMUNITY)
Admission: EM | Admit: 2012-01-01 | Discharge: 2012-01-01 | Disposition: A | Discharge status: Home / Self Care | Payer: Self-pay | Attending: Emergency Medicine | Admitting: Emergency Medicine

## 2012-01-01 DIAGNOSIS — R059 Cough, unspecified: Secondary | ICD-10-CM | POA: Insufficient documentation

## 2012-01-01 DIAGNOSIS — H9209 Otalgia, unspecified ear: Secondary | ICD-10-CM | POA: Insufficient documentation

## 2012-01-01 DIAGNOSIS — R05 Cough: Secondary | ICD-10-CM | POA: Insufficient documentation

## 2012-01-01 DIAGNOSIS — J329 Chronic sinusitis, unspecified: Secondary | ICD-10-CM | POA: Insufficient documentation

## 2012-01-01 MED ORDER — AMOXICILLIN 500 MG PO CAPS: 500.0000 mg | ORAL_CAPSULE | Freq: Three times a day (TID) | ORAL | Status: DC

## 2012-01-01 NOTE — ED Notes (Signed)
Patient currently resting quietly in bed; no respiratory or acute distress noted.  Patient updated on plan of care; informed patient that we are currently waiting on further orders/disposition from PA.  Patient denies any needs at this time; will continue to monitor.

## 2012-01-01 NOTE — ED Notes (Signed)
Patient reports cold symptoms that started about a week ago (i.e. Runny/stuff nose, generalized body aches, earache and fatigue).  Patient denies chest pain, shortness of breath, nausea, and vomiting.  Patient alert and oriented x4; PERRL present.

## 2012-01-01 NOTE — ED Provider Notes (Signed)
History     CSN: [id number]  Arrival date & time 01/01/12  4098   First MD Initiated Contact with Patient 01/01/12 2011      Chief Complaint  Patient presents with  . Nasal Congestion    (Consider location/radiation/quality/duration/timing/severity/associated sxs/prior treatment) HPI Comments: This is a 26 year old female, who presents emergency department with chief complaint of ear pain, nasal congestion, and productive cough x2 weeks. She states that her symptoms improved about a week ago, but they have now worsened. She reports having a fever of up to 101. She is mildly febrile in the emergency department. She states that she is taking NyQuil and Sudafed with some relief. She endorses ear pain, sinus pain, runny nose and congestion, and productive cough. She denies chest pain, shortness of breath, nausea, vomiting, diarrhea, constipation. She states that her discomfort is moderate.  The history is provided by the patient. No language interpreter was used.    Past Medical History  Diagnosis Date  . History of chlamydia   . Abnormal Pap smear     Past Surgical History  Procedure Date  . Hernia repair   . No past surgeries     Family History  Problem Relation Age of Onset  . Hypertension Father   . Diabetes Father   . Asthma Brother   . Stroke Paternal Uncle   . Anesthesia problems Neg Hx   . Hypotension Neg Hx   . Pseudochol deficiency Neg Hx   . Malignant hyperthermia Neg Hx     History  Substance Use Topics  . Smoking status: Never Smoker   . Smokeless tobacco: Never Used  . Alcohol Use: No    OB History    Grav Para Term Preterm Abortions TAB SAB Ect Mult Living   1 1 1  0 0 0 0 0 0 1      Review of Systems  All other systems reviewed and are negative.    Allergies  Latex  Home Medications   Current Outpatient Rx  Name  Route  Sig  Dispense  Refill  . IBUPROFEN 200 MG PO TABS   Oral   Take 600 mg by mouth every 6 (six) hours as needed. For  pain         . NYQUIL D COLD/FLU PO   Oral   Take 30 mLs by mouth daily as needed.         Marland Kitchen PSEUDOEPHEDRINE HCL 30 MG PO TABS   Oral   Take 30 mg by mouth every 4 (four) hours as needed. For congestion           BP 128/69  Pulse 91  Temp 99.3 F (37.4 C) (Oral)  Resp 16  SpO2 98%  LMP 12/21/2011  Physical Exam  Nursing note and vitals reviewed. Constitutional: She is oriented to person, place, and time. She appears well-developed and well-nourished.  HENT:  Head: Normocephalic and atraumatic.  Mouth/Throat: Oropharynx is clear and moist. No oropharyngeal exudate.       Swollen, erythematous turbinates, maxillary sinuses tender to palpation, congestion behind bilateral tympanic membranes.  Eyes: Conjunctivae normal and EOM are normal. Pupils are equal, round, and reactive to light.  Neck: Normal range of motion. Neck supple.  Cardiovascular: Normal rate, regular rhythm and normal heart sounds.   Pulmonary/Chest: Effort normal and breath sounds normal. No respiratory distress. She has no wheezes. She has no rales. She exhibits no tenderness.  Abdominal: Soft. Bowel sounds are normal.  Musculoskeletal: Normal range of  motion.  Neurological: She is alert and oriented to person, place, and time.  Skin: Skin is warm and dry.  Psychiatric: She has a normal mood and affect. Her behavior is normal. Judgment and thought content normal.    ED Course  Procedures (including critical care time)    01/01/2012  *RADIOLOGY REPORT*  Clinical Data: Productive cough with fever  CHEST - 2 VIEW  Comparison: December 12, 2007  Findings:  Lungs clear.  Heart size and pulmonary vascularity are normal.  No adenopathy.  No bone lesions.  IMPRESSION: No abnormality noted.   Original Report Authenticated By: Bretta Bang, M.D.       1. Sinusitis       MDM  26 year old female with sinusitis. I will treat the patient with amoxicillin. Recommends continuing NyQuil and Sudafed.  Patient understands and agrees with plan. She is stable and ready for discharge. Recommend primary care followup, I will give the patient the resource list.        Roxy Horseman, PA-C 01/01/12 2127  Roxy Horseman, PA-C 01/01/12 2128

## 2012-01-01 NOTE — ED Notes (Signed)
Pt reports "cold symptoms," headache, and runny nose last week, pt reports bilateral ear pain, nasal congestion, weakness, and all over body aches x2 days

## 2012-01-01 NOTE — ED Notes (Signed)
PA at bedside.

## 2012-01-01 NOTE — ED Notes (Signed)
Patient given copy of discharge paperwork; went over discharge instructions with patient.  Patient instructed to take antibiotic as directed and to finish entire antibiotic prescription.  Instructed to follow up with primary care physician and to return to the ED for new, worsening, or concerning symptoms.

## 2012-01-05 NOTE — ED Provider Notes (Signed)
History/physical exam/procedure(s) were performed by non-physician practitioner and as supervising physician I was immediately available for consultation/collaboration. I have reviewed all notes and am in agreement with care and plan.   Hilario Quarry, MD 01/05/12 [phone]

## 2012-02-03 ENCOUNTER — Emergency Department (INDEPENDENT_AMBULATORY_CARE_PROVIDER_SITE_OTHER)
Admission: EM | Admit: 2012-02-03 | Discharge: 2012-02-03 | Disposition: A | Discharge status: Home / Self Care | Payer: Self-pay | Source: Home / Self Care | Attending: Emergency Medicine | Admitting: Emergency Medicine

## 2012-02-03 ENCOUNTER — Encounter (HOSPITAL_COMMUNITY): Payer: Self-pay | Admitting: Emergency Medicine

## 2012-02-03 DIAGNOSIS — L259 Unspecified contact dermatitis, unspecified cause: Secondary | ICD-10-CM

## 2012-02-03 MED ORDER — METHYLPREDNISOLONE ACETATE 80 MG/ML IJ SUSP
INTRAMUSCULAR | Status: AC
  Filled 2012-02-03: qty 1

## 2012-02-03 MED ORDER — PREDNISONE 10 MG PO TABS: ORAL_TABLET | ORAL | Status: DC

## 2012-02-03 MED ORDER — HYDROCORTISONE 1 % EX CREA: TOPICAL_CREAM | CUTANEOUS | Status: DC

## 2012-02-03 MED ORDER — CEPHALEXIN 500 MG PO CAPS: 500.0000 mg | ORAL_CAPSULE | Freq: Three times a day (TID) | ORAL | Status: DC

## 2012-02-03 MED ORDER — METHYLPREDNISOLONE ACETATE 80 MG/ML IJ SUSP
80.0000 mg | Freq: Once | INTRAMUSCULAR | Status: AC
  Administered 2012-02-03: 80 mg via INTRAMUSCULAR

## 2012-02-03 NOTE — ED Notes (Signed)
Rash/blisters to face, around lips and sides of face, particularly left side of face.  Patient reports itching, onset 3 weeks ago

## 2012-02-03 NOTE — ED Provider Notes (Signed)
Chief Complaint  Patient presents with  . Rash    History of Present Illness:   Rhonda Barron is a 27 year old female with a three-week history of a rash on her face. A month ago she was at the emergency room because of some upper respiratory symptoms and diagnosed with sinusitis. She was given a course of amoxicillin which she has long since finished. 3 weeks ago she drank a large glass of pineapple juice. The next day she noted a small rash on her lips which then spread to include her entire upper and lower lip, cheeks, and particularly the left cheek and zygomatic arch area. This is blistered, is oozing, and crusted. It is very pruritic and slightly painful at times. She denies any fever or chills. She denies any rash elsewhere on her body and there no intraoral lesions. She has not been exposed to anything else as far as changes in soaps, detergents, washing powders, dryer sheets, fabric softeners, cosmetics, or lotions. She's been applying a number of different over-the-counter remedies which haven't done much good. She denies any changes in foods or new medications. She's had no shortness of breath or swelling of the lips, tongue, or throat. 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 The gloves are nonlatex containing gloves.  Review of Systems:  Other than noted above, the patient denies any of the following symptoms: Systemic:  No fever, chills, sweats, weight loss, or fatigue. ENT:  No nasal congestion, rhinorrhea, sore throat, swelling of lips, tongue or throat. Resp:  No cough, wheezing, or shortness of breath. Skin:  No rash, itching, nodules, or suspicious lesions.  PMFSH:  Past medical history, family history, social history, meds, and allergies were reviewed.  Physical Exam:   Vital signs:  BP 119/74  Pulse 76  Temp 98 F (36.7 C) (Oral)  Resp 18  Ht 5\' 7"  (1.702 m)  Wt 235 lb (106.595 kg)  BMI  36.81 kg/m2  SpO2 100%  LMP 01/29/2012  Breastfeeding? No Gen:  Alert, oriented, in no distress. ENT:  Pharynx clear, no intraoral lesions, moist mucous membranes. There no intraoral lesions or extension of the rash intraorally. Lungs:  Clear to auscultation. Skin:  She has an extensive, blistered rash involving the lips, cheeks, and a large area on the left cheek and zygomatic arch area. This is oozing and crusted.       Course in Urgent Care Center:   She was given Depo-Medrol 80 mg IM.  Assessment:  The encounter diagnosis was Contact dermatitis.  Plan:   1.  The following meds were prescribed:   New Prescriptions   CEPHALEXIN (KEFLEX) 500 MG CAPSULE    Take 1 capsule (500 mg total) by mouth 3 (three) times daily.   HYDROCORTISONE CREAM 1 %    Apply to affected area 4 times daily   PREDNISONE (DELTASONE) 10 MG TABLET    Take 4 tabs daily for 4 days, 3 tabs daily for 4 days, 2 tabs daily for 4 days, then 1 tab daily for 4 days.   2.  The patient was instructed in symptomatic care and handouts were given. 3.  The patient was told to return if becoming worse in any way, if no better in 3 or 4 days, and given some red flag symptoms that would indicate earlier return.  Follow up:  The patient was told to follow up with Dr. Para Skeans if  no better in one week.      Reuben Likes, MD 02/03/12 [phone]

## 2012-02-03 NOTE — ED Notes (Signed)
Patient asked about programs for uninsured.  Escorted to registration and [employer]

## 2012-04-02 ENCOUNTER — Emergency Department (HOSPITAL_COMMUNITY)
Admission: EM | Admit: 2012-04-02 | Discharge: 2012-04-02 | Disposition: A | Discharge status: Home / Self Care | Payer: Self-pay | Attending: Emergency Medicine | Admitting: Emergency Medicine

## 2012-04-02 ENCOUNTER — Encounter (HOSPITAL_COMMUNITY): Payer: Self-pay | Admitting: *Deleted

## 2012-04-02 DIAGNOSIS — Z8619 Personal history of other infectious and parasitic diseases: Secondary | ICD-10-CM | POA: Insufficient documentation

## 2012-04-02 DIAGNOSIS — IMO0002 Reserved for concepts with insufficient information to code with codable children: Secondary | ICD-10-CM | POA: Insufficient documentation

## 2012-04-02 DIAGNOSIS — L02412 Cutaneous abscess of left axilla: Secondary | ICD-10-CM

## 2012-04-02 DIAGNOSIS — Z8742 Personal history of other diseases of the female genital tract: Secondary | ICD-10-CM | POA: Insufficient documentation

## 2012-04-02 MED ORDER — CEPHALEXIN 500 MG PO CAPS: 500.0000 mg | ORAL_CAPSULE | Freq: Two times a day (BID) | ORAL | Status: DC

## 2012-04-02 MED ORDER — HYDROCODONE-ACETAMINOPHEN 5-325 MG PO TABS: 1.0000 | ORAL_TABLET | Freq: Four times a day (QID) | ORAL | Status: DC | PRN

## 2012-04-02 MED ORDER — SULFAMETHOXAZOLE-TRIMETHOPRIM 800-160 MG PO TABS: 1.0000 | ORAL_TABLET | Freq: Two times a day (BID) | ORAL | Status: DC

## 2012-04-02 NOTE — ED Notes (Signed)
Patient is alert and orientedx4.  Patient was explained discharge instructions and they understood them with no questions.   

## 2012-04-02 NOTE — ED Notes (Signed)
Pt c/o abcess to the left axilla.  States it has gotten bigger over the past week.  Denies chills/fever, n/v

## 2012-04-02 NOTE — ED Provider Notes (Signed)
History  This chart was scribed for non-physician practitioner Jaynie Crumble, PA-C working with Nelia Shi, MD, by Candelaria Stagers, ED Scribe. This patient was seen in room TR05C/TR05C and the patient's care was started at 10:10 PM   CSN: [id number]  Arrival date & time 04/02/12  2033   First MD Initiated Contact with Patient 04/02/12 2209      Chief Complaint  Patient presents with  . Abcess      The history is provided by the patient. No language interpreter was used.  Rhonda Barron is a 27 y.o. female who presents to the Emergency Department complaining of an abscess to the left axilla that she noticed one week ago and has gradually worsened.  She reports she has used a new deodorant recently and has experienced several bumps to the axilla, but this one is large and tender.  She denies fever or chills.  Nothing seems to make the sx better or worse.  Pt has experienced an abscess before, but has never had one incised.   Past Medical History  Diagnosis Date  . History of chlamydia   . Abnormal Pap smear     Past Surgical History  Procedure Laterality Date  . No past surgeries      Family History  Problem Relation Age of Onset  . Hypertension Father   . Diabetes Father   . Asthma Brother   . Stroke Paternal Uncle   . Anesthesia problems Neg Hx   . Hypotension Neg Hx   . Pseudochol deficiency Neg Hx   . Malignant hyperthermia Neg Hx     History  Substance Use Topics  . Smoking status: Never Smoker   . Smokeless tobacco: Never Used  . Alcohol Use: Yes     Comment: occ    OB History   Grav Para Term Preterm Abortions TAB SAB Ect Mult Living   1 1 1  0 0 0 0 0 0 1      Review of Systems  Skin:       Abscess to the left axilla   All other systems reviewed and are negative.    Allergies  Latex  Home Medications   Current Outpatient Rx  Name  Route  Sig  Dispense  Refill  . ibuprofen (ADVIL,MOTRIN) 200 MG tablet   Oral   Take 600 mg by  mouth every 6 (six) hours as needed. For pain           BP 143/84  Pulse 86  Temp(Src) 97.9 F (36.6 C) (Oral)  Resp 16  SpO2 100%  LMP 02/23/2012  Physical Exam  Nursing note and vitals reviewed. Constitutional: She is oriented to person, place, and time. She appears well-developed and well-nourished. No distress.  HENT:  Head: Normocephalic and atraumatic.  Eyes: EOM are normal.  Neck: Neck supple. No tracheal deviation present.  Cardiovascular: Normal rate.   Pulmonary/Chest: Effort normal. No respiratory distress.  Musculoskeletal: Normal range of motion.  Neurological: She is alert and oriented to person, place, and time.  Skin: Skin is warm and dry.  3 cm x 3 cm abscess with surrounding cellulitis and induration to left axilla.  Area tender to palpation.   Psychiatric: She has a normal mood and affect. Her behavior is normal.    ED Course  Procedures   DIAGNOSTIC STUDIES: Oxygen Saturation is 100% on room air, normal by my interpretation.    COORDINATION OF CARE:  10:14 PM Discussed course of care with pt  which includes I&D of abscess.  Pt understands and agrees.   INCISION AND DRAINAGE PROCEDURE NOTE: Patient identification was confirmed and verbal consent was obtained. This procedure was performed by Jaynie Crumble, PA-C at 10:14 PM. Site: left axilla  Sterile procedures observed Needle size: 25 gauge Anesthetic used (type and amt): 2% lidocaine with epi, 4 cc used Blade size: 11 blade Drainage: large amount of drainage Complexity: Complex Packing used  Site anesthetized, incision made over site, wound drained and explored loculations, rinsed with copious amounts of normal saline, packing in place covered with dry, sterile dressing.  Pt tolerated procedure well without complications.  Instructions for care discussed verbally and pt provided with additional written instructions for homecare and f/u.   Labs Reviewed - No data to display No results  found.   1. Abscess of left axilla       MDM  Pt with left axillary abscess. Mild surrounding cellulitis. She is afebrile. Non toxic. Abscess I&Ded. Will start on bactrim and keflex for infection. Pain medications. Follow up in two days for recheck.   Filed Vitals:   04/02/12 2042  BP: 143/84  Pulse: 86  Temp: 97.9 F (36.6 C)  TempSrc: Oral  Resp: 16  SpO2: 100%      I personally performed the services described in this documentation, which was scribed in my presence. The recorded information has been reviewed and is accurate.     Lottie Mussel, PA-C 04/02/12 2356

## 2012-04-03 NOTE — ED Provider Notes (Signed)
Medical screening examination/treatment/procedure(s) were performed by non-physician practitioner and as supervising physician I was immediately available for consultation/collaboration.   Nelia Shi, MD 04/03/12 [phone]

## 2013-11-14 ENCOUNTER — Encounter (HOSPITAL_COMMUNITY): Payer: Self-pay | Admitting: *Deleted

## 2014-05-15 ENCOUNTER — Inpatient Hospital Stay (HOSPITAL_COMMUNITY)
Admission: AD | Admit: 2014-05-15 | Discharge: 2014-05-15 | Disposition: A | Discharge status: Home / Self Care | Payer: Medicaid Other | Source: Ambulatory Visit | Attending: Obstetrics & Gynecology | Admitting: Obstetrics & Gynecology

## 2014-05-15 ENCOUNTER — Inpatient Hospital Stay (HOSPITAL_COMMUNITY): Payer: Medicaid Other

## 2014-05-15 ENCOUNTER — Encounter (HOSPITAL_COMMUNITY): Payer: Self-pay | Admitting: *Deleted

## 2014-05-15 DIAGNOSIS — R109 Unspecified abdominal pain: Secondary | ICD-10-CM | POA: Insufficient documentation

## 2014-05-15 DIAGNOSIS — O9989 Other specified diseases and conditions complicating pregnancy, childbirth and the puerperium: Secondary | ICD-10-CM | POA: Insufficient documentation

## 2014-05-15 DIAGNOSIS — Z3A01 Less than 8 weeks gestation of pregnancy: Secondary | ICD-10-CM | POA: Insufficient documentation

## 2014-05-15 DIAGNOSIS — Z91040 Latex allergy status: Secondary | ICD-10-CM

## 2014-05-15 DIAGNOSIS — K4090 Unilateral inguinal hernia, without obstruction or gangrene, not specified as recurrent: Secondary | ICD-10-CM

## 2014-05-15 DIAGNOSIS — O26899 Other specified pregnancy related conditions, unspecified trimester: Secondary | ICD-10-CM

## 2014-05-15 DIAGNOSIS — Z3200 Encounter for pregnancy test, result unknown: Secondary | ICD-10-CM | POA: Diagnosis present

## 2014-05-15 LAB — URINALYSIS, ROUTINE W REFLEX MICROSCOPIC
BILIRUBIN URINE: NEGATIVE
Glucose, UA: NEGATIVE mg/dL
HGB URINE DIPSTICK: NEGATIVE
KETONES UR: 15 mg/dL — AB
LEUKOCYTES UA: NEGATIVE
Nitrite: NEGATIVE
PH: 6.5 (ref 5.0–8.0)
PROTEIN: NEGATIVE mg/dL
Specific Gravity, Urine: 1.025 (ref 1.005–1.030)
UROBILINOGEN UA: 1 mg/dL (ref 0.0–1.0)

## 2014-05-15 LAB — POCT PREGNANCY, URINE: PREG TEST UR: POSITIVE — AB

## 2014-05-15 NOTE — MAU Note (Signed)
Irena Reichmann CNM in L&D, to come to MAU when finished.

## 2014-05-15 NOTE — MAU Note (Signed)
PT SAYS HAD INPANON-  [id number].   FROM  [facility]     TAKEN  OUT  03-2014  -  AT   [facility]  HAS HAD  SPOTTING.  NO OTHER  BIRTH  CONTROL.   LAST SEX-  ON SAT.   CRAMPING- STARTED 4-2    STOPPED  THEN AGAIN  ON 4-13.     CRAMPING  NOW.   ALL LAST WEEK - FELT WEAK , DIZZY   AND NAUSEA.      HPT-  POSITIVE.

## 2014-05-15 NOTE — Discharge Instructions (Signed)
Abdominal Pain During Pregnancy °Belly (abdominal) pain is common during pregnancy. Most of the time, it is not a serious problem. Other times, it can be a sign that something is wrong with the pregnancy. Always tell your doctor if you have belly pain. °HOME CARE °Monitor your belly pain for any changes. The following actions may help you feel better: °· Do not have sex (intercourse) or put anything in your vagina until you feel better. °· Rest until your pain stops. °· Drink clear fluids if you feel sick to your stomach (nauseous). Do not eat solid food until you feel better. °· Only take medicine as told by your doctor. °· Keep all doctor visits as told. °GET HELP RIGHT AWAY IF:  °· You are bleeding, leaking fluid, or pieces of tissue come out of your vagina. °· You have more pain or cramping. °· You keep throwing up (vomiting). °· You have pain when you pee (urinate) or have blood in your pee. °· You have a fever. °· You do not feel your baby moving as much. °· You feel very weak or feel like passing out. °· You have trouble breathing, with or without belly pain. °· You have a very bad headache and belly pain. °· You have fluid leaking from your vagina and belly pain. °· You keep having watery poop (diarrhea). °· Your belly pain does not go away after resting, or the pain gets worse. °MAKE SURE YOU:  °· Understand these instructions. °· Will watch your condition. °· Will get help right away if you are not doing well or get worse. °Document Released: 12/18/2008 Document Revised: 09/01/2012 Document Reviewed: 07/29/2012 °Exit[employer]® Patient Information ©2015 [employer]. This information is not intended to replace advice given to you by your health care provider. Make sure you discuss any questions you have with your health care provider. ° °

## 2014-05-16 NOTE — MAU Provider Note (Signed)
History  29 yo G2P1 presents to MAU for pregnancy confirmation. States felt weak, tired and queasy today and took a pregnancy test and noted it to be positive. Has been cramping for 1 week.  Last seen at [facility] on 03/27/14 to have Nexplanon removed. Has had unprotected sex since then. Last intercourse and alcohol consumption this past Saturday.  Pregnancy unplanned but desired.  Patient Active Problem List   Diagnosis Date Noted  . Abdominal pain during pregnancy 05/15/2014  . 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 Inguinal hernia - pediatric w/ laparscopic exam 05/15/2014  . Obese 12/23/2010  . History of abnormal Pap smear 12/23/2010    No chief complaint on file.  HPI As above OB History    Gravida Para Term Preterm AB TAB SAB Ectopic Multiple Living   2 1 1  0 0 0 0 0 0 1      Past Medical History  Diagnosis Date  . History of chlamydia   . Abnormal Pap smear     Past Surgical History  Procedure Laterality Date  . No past surgeries    . Inguinal hernia pediatric with laparoscopic exam      Family History  Problem Relation Age of Onset  . Hypertension Father   . Diabetes Father   . Asthma Brother   . Stroke Paternal Uncle   . Anesthesia problems Neg Hx   . Hypotension Neg Hx   . Pseudochol deficiency Neg Hx   . Malignant hyperthermia Neg Hx     History  Substance Use Topics  . Smoking status: Never Smoker   . Smokeless tobacco: Never Used  . Alcohol Use: Yes     Comment: occ    Allergies:  Allergies  Allergen Reactions  . Latex Itching    No prescriptions prior to admission    ROS  Cramping Physical Exam   Blood pressure 135/74, pulse 66, temperature 98.5 F (36.9 C), temperature source Oral, resp. rate 18, height 5\' 6"  (1.676 m), weight 108.126 kg (238 lb 6 oz), last menstrual period 05/15/2014.  Results for orders placed or performed during the hospital encounter of 05/15/14 (from the past 24 hour(s))  Urinalysis, Routine w reflex microscopic      Status: Abnormal   Collection Time: 05/15/14  8:05 PM  Result Value Ref Range   Color, Urine YELLOW YELLOW   APPearance CLEAR CLEAR   Specific Gravity, Urine 1.025 1.005 - 1.030   pH 6.5 5.0 - 8.0   Glucose, UA NEGATIVE NEGATIVE mg/dL   Hgb urine dipstick NEGATIVE NEGATIVE   Bilirubin Urine NEGATIVE NEGATIVE   Ketones, ur 15 (A) NEGATIVE mg/dL   Protein, ur NEGATIVE NEGATIVE mg/dL   Urobilinogen, UA 1.0 0.0 - 1.0 mg/dL   Nitrite NEGATIVE NEGATIVE   Leukocytes, UA NEGATIVE NEGATIVE  Pregnancy, urine POC     Status: Abnormal   Collection Time: 05/15/14  8:25 PM  Result Value Ref Range   Preg Test, Ur POSITIVE (A) NEGATIVE    CLINICAL DATA: Early pregnancy. Cramping for 1 week. Positive urine pregnancy test. Unknown dates.  EXAM: OBSTETRIC <14 WK [place] AND TRANSVAGINAL OB US  TECHNIQUE: Both transabdominal and transvaginal ultrasound examinations were performed for complete evaluation of the gestation as well as the maternal uterus, adnexal regions, and pelvic cul-de-sac. Transvaginal technique was performed to assess early pregnancy.  COMPARISON: None.  FINDINGS: Intrauterine gestational sac: Visualized/normal in shape.  Yolk sac: Present  Embryo: Present  Cardiac Activity: Present  Heart Rate: 120 bpm  CRL:  3.6 mm 6 w 1 d [place] EDC: 01/07/2015.  Maternal uterus/adnexae: No subchorionic hemorrhage. Ovaries have a normal physiologic appearance. No free fluid.  IMPRESSION: Uncomplicated single intrauterine pregnancy.   Electronically Signed  By: Dereck Ligas M.D.  On: 05/15/2014 21:25   Physical Exam Gen: NAD Abdomen: soft, NT, no guarding or rebound Ext: WNL ED Course  Assessment: IUP at 6.1 wks by u/s Normal pregnancy discomforts  Plan: Reviewed UPT and u/s results Reviewed common discomforts of pregnancy, along w/ their relief measures Informed of need to establish care with our office ASAP Work  note Pregnancy verification letter provided for Medicaid application    TYLIA, EWELL CNM, MS 05/15/14, 09:00 PM

## 2014-12-20 ENCOUNTER — Other Ambulatory Visit (HOSPITAL_COMMUNITY): Payer: Self-pay | Admitting: Obstetrics and Gynecology

## 2014-12-20 DIAGNOSIS — O99214 Obesity complicating childbirth: Secondary | ICD-10-CM

## 2014-12-22 ENCOUNTER — Ambulatory Visit (HOSPITAL_COMMUNITY)
Admission: RE | Admit: 2014-12-22 | Discharge: 2014-12-22 | Disposition: A | Discharge status: Home / Self Care | Payer: Medicaid Other | Source: Ambulatory Visit | Attending: Obstetrics and Gynecology | Admitting: Obstetrics and Gynecology

## 2014-12-22 ENCOUNTER — Other Ambulatory Visit (HOSPITAL_COMMUNITY): Payer: Self-pay | Admitting: Obstetrics and Gynecology

## 2014-12-22 ENCOUNTER — Encounter (HOSPITAL_COMMUNITY): Payer: Self-pay

## 2014-12-22 DIAGNOSIS — O4103X1 Oligohydramnios, third trimester, fetus 1: Secondary | ICD-10-CM

## 2014-12-22 DIAGNOSIS — Z36 Encounter for antenatal screening of mother: Secondary | ICD-10-CM | POA: Insufficient documentation

## 2014-12-22 DIAGNOSIS — O4103X0 Oligohydramnios, third trimester, not applicable or unspecified: Secondary | ICD-10-CM | POA: Diagnosis not present

## 2014-12-22 DIAGNOSIS — O99214 Obesity complicating childbirth: Secondary | ICD-10-CM

## 2014-12-22 DIAGNOSIS — O99213 Obesity complicating pregnancy, third trimester: Secondary | ICD-10-CM | POA: Insufficient documentation

## 2014-12-22 DIAGNOSIS — Z3A37 37 weeks gestation of pregnancy: Secondary | ICD-10-CM | POA: Diagnosis not present

## 2014-12-22 NOTE — ED Notes (Signed)
Pt reports having a clear, watery discharge x 2 weeks.  Evaluated in the office.

## 2014-12-25 ENCOUNTER — Encounter (HOSPITAL_COMMUNITY): Payer: Self-pay

## 2015-01-04 ENCOUNTER — Encounter (HOSPITAL_COMMUNITY): Payer: Self-pay | Admitting: *Deleted

## 2015-01-04 ENCOUNTER — Encounter (HOSPITAL_COMMUNITY): Payer: Self-pay | Admitting: Anesthesiology

## 2015-01-04 ENCOUNTER — Inpatient Hospital Stay (HOSPITAL_COMMUNITY)
Admission: AD | Admit: 2015-01-04 | Discharge: 2015-01-08 | DRG: 765 | Disposition: A | Discharge status: Home / Self Care | Payer: Medicaid Other | Source: Ambulatory Visit | Attending: Obstetrics and Gynecology | Admitting: Obstetrics and Gynecology

## 2015-01-04 DIAGNOSIS — O9962 Diseases of the digestive system complicating childbirth: Secondary | ICD-10-CM | POA: Diagnosis present

## 2015-01-04 DIAGNOSIS — A6000 Herpesviral infection of urogenital system, unspecified: Secondary | ICD-10-CM | POA: Diagnosis present

## 2015-01-04 DIAGNOSIS — O9852 Other viral diseases complicating childbirth: Principal | ICD-10-CM | POA: Diagnosis present

## 2015-01-04 DIAGNOSIS — Z3A39 39 weeks gestation of pregnancy: Secondary | ICD-10-CM

## 2015-01-04 DIAGNOSIS — R51 Headache: Secondary | ICD-10-CM | POA: Diagnosis present

## 2015-01-04 DIAGNOSIS — O99214 Obesity complicating childbirth: Secondary | ICD-10-CM | POA: Diagnosis present

## 2015-01-04 DIAGNOSIS — B009 Herpesviral infection, unspecified: Secondary | ICD-10-CM | POA: Diagnosis present

## 2015-01-04 DIAGNOSIS — M549 Dorsalgia, unspecified: Secondary | ICD-10-CM | POA: Diagnosis present

## 2015-01-04 DIAGNOSIS — K219 Gastro-esophageal reflux disease without esophagitis: Secondary | ICD-10-CM | POA: Diagnosis present

## 2015-01-04 DIAGNOSIS — Z6841 Body Mass Index (BMI) 40.0 and over, adult: Secondary | ICD-10-CM

## 2015-01-04 LAB — POCT FERN TEST: POCT FERN TEST: POSITIVE

## 2015-01-04 MED ORDER — FAMOTIDINE IN NACL 20-0.9 MG/50ML-% IV SOLN
INTRAVENOUS | Status: AC
Start: 2015-01-04 — End: 2015-01-05
  Filled 2015-01-04: qty 50

## 2015-01-04 MED ORDER — FAMOTIDINE IN NACL 20-0.9 MG/50ML-% IV SOLN
20.0000 mg | Freq: Once | INTRAVENOUS | Status: AC
  Administered 2015-01-05: 20 mg via INTRAVENOUS

## 2015-01-04 MED ORDER — LACTATED RINGERS IV SOLN
INTRAVENOUS | Status: DC
  Administered 2015-01-05: 1000 mL via INTRAVENOUS
  Administered 2015-01-05 (×2): via INTRAVENOUS

## 2015-01-04 MED ORDER — CITRIC ACID-SODIUM CITRATE 334-500 MG/5ML PO SOLN
30.0000 mL | Freq: Once | ORAL | Status: AC
  Administered 2015-01-05: 30 mL via ORAL

## 2015-01-04 MED ORDER — CITRIC ACID-SODIUM CITRATE 334-500 MG/5ML PO SOLN
ORAL | Status: AC
  Filled 2015-01-04: qty 15

## 2015-01-04 MED ORDER — DEXTROSE 5 % IV SOLN
3.0000 g | INTRAVENOUS | Status: AC
  Administered 2015-01-05: 3 g via INTRAVENOUS
  Filled 2015-01-04: qty 3000

## 2015-01-04 NOTE — MAU Note (Signed)
Pt c/o contractions, back pain and headache. HA started 3 days ago-told Dr in office this past week, bp normal. Back pain, contractions and LOF today. +FM

## 2015-01-04 NOTE — H&P (Signed)
Rhonda Barron is a 29 y.o. G2P1001 at 39.4wks presenting for HA, Back pain, Contractions, and LOF.  Patient states contractions have been ongoing for days, but have intensified after LOF around 1700.  Patient also reports HA x 2 days with minimal relief from tylenol dosing every 4 hours.  Patient reports active fetus and denies VB.  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  Patient GBS negative and desires a BTL.   Maternal Medical History:  Reason for admission: Rupture of membranes and contractions.   Contractions: Onset was 13-24 hours ago.   Frequency: irregular.   Perceived severity is moderate.    Fetal activity: Perceived fetal activity is normal.   Last perceived fetal movement was within the past hour.    Prenatal complications: Infection (HSV Outbreak, DX 12/21).   Prenatal Complications - Diabetes: none.    OB History    Gravida Para Term Preterm AB TAB SAB Ectopic Multiple Living   2 1 1  0 0 0 0 0 0 1     Past Medical History  Diagnosis Date  . History of chlamydia   . Abnormal Pap smear    Past Surgical History  Procedure Laterality Date  . No past surgeries    . Inguinal hernia pediatric with laparoscopic exam     Family History: family history includes Asthma in her brother; Diabetes in her father; Hypertension in her father; Stroke in her paternal uncle. There is no history of Anesthesia problems, Hypotension, Pseudochol deficiency, or Malignant hyperthermia. Social History:  reports that she has never smoked. She has never used smokeless tobacco. She reports that she drinks alcohol. She reports that she does not use illicit drugs.   Prenatal Transfer Tool  Maternal Diabetes: No Genetic Screening: Declined Maternal Ultrasounds/Referrals: Normal Fetal Ultrasounds or other Referrals:  None Maternal Substance Abuse:  No Significant Maternal Medications:  None Significant Maternal Lab Results:  Lab  values include: Group B Strep negative, Other: HSV 1&2 Positive Other Comments:  None  Review of Systems  Constitutional: Negative.   Eyes: Negative.   Gastrointestinal: Negative.   Genitourinary: Negative.   Neurological: Positive for headaches.    Dilation: 3.5 Effacement (%): 50 Station: -3 Exam by:: Gavin Pound CNM Blood pressure 132/69, pulse 78, temperature 97.9 F (36.6 C), temperature source Oral, resp. rate 18, height 5' 6.5" (1.689 m), weight 121.473 kg (267 lb 12.8 oz), last menstrual period 04/02/2014, SpO2 98 %. Maternal Exam:  Abdomen: Patient reports no abdominal tenderness. Fundal height is AGA.   Estimated fetal weight is 7lbs.   Fetal presentation: vertex  Introitus: Vulva is positive for lesion. Vagina is positive for vaginal discharge.  Ferning test: positive.  Nitrazine test: not done. Amniotic fluid character: meconium stained and foul-smelling.  Cervix: Cervix evaluated by sterile speculum exam.     Fetal Exam Fetal Monitor Review: Mode: ultrasound.   Baseline rate: 125.  Variability: moderate (6-25 bpm).   Pattern: accelerations present.    Fetal State Assessment: Category I - tracings are normal.     Physical Exam  Vitals reviewed. Constitutional: She is oriented to person, place, and time. No distress.  Obese  HENT:  Head: Normocephalic and atraumatic.  Eyes: EOM are normal. Pupils are equal, round, and reactive to light.  Neck: Normal range of motion.  Cardiovascular: Normal rate.   Respiratory: Effort normal.  GI: Soft.  Genitourinary: Vulva exhibits lesion. Vaginal discharge found.  Musculoskeletal: Normal range of  motion. She exhibits edema.  Neurological: She is alert and oriented to person, place, and time.  Skin: Skin is warm and dry.    Prenatal labs: ABO, Rh:  A positive Antibody:  Negative Rubella:  Immune RPR:   NR HBsAg:   Negative HIV:   NR GBS:   Negative  Assessment IUP at 39.4wks Cat I FT SROM HSV  Outbreak GBS Negative Primary C/S Desires BTL  Plan: Admit for Primary C/S per consult with Dr. AVS Routine PreOperative Orders per [facility] Protocol In room to complete assessment and discuss POC Consented for C/S as R/B reviewed including, but not limited to, infection, bleeding, pain, damage to organs or fetus resulting in need for additional surgery.  Patient understands and accepts these risks and wishes to proceed with c/s. Add on [facility] labs for HA Obtain PC ratio after placement of foley catheter Will discuss BTL as provider unable to locate signed consent papers Dr. AVS en route.   Maryann Conners MSN, CNM 01/04/2015, 11:38 PM

## 2015-01-04 NOTE — Anesthesia Preprocedure Evaluation (Signed)
Anesthesia Evaluation  Patient identified by MRN, date of birth, ID band Patient awake    Reviewed: Allergy & Precautions, NPO status , Patient's Chart, lab work & pertinent test results  Airway Mallampati: III  TM Distance: >3 FB Neck ROM: Full    Dental no notable dental hx. (+) Teeth Intact   Pulmonary neg pulmonary ROS,    Pulmonary exam normal breath sounds clear to auscultation       Cardiovascular negative cardio ROS Normal cardiovascular exam Rhythm:Regular Rate:Normal     Neuro/Psych negative neurological ROS  negative psych ROS   GI/Hepatic Neg liver ROS, GERD  Medicated,  Endo/Other  Morbid obesity  Renal/GU negative Renal ROS  negative genitourinary   Musculoskeletal negative musculoskeletal ROS (+)   Abdominal (+) + obese,   Peds  Hematology   Anesthesia Other Findings   Reproductive/Obstetrics (+) Pregnancy 39 4/7 weeks SROM in active labor Genital herpes outbreak                             Anesthesia Physical Anesthesia Plan  ASA: III and emergent  Anesthesia Plan: Spinal   Post-op Pain Management:    Induction:   Airway Management Planned: Natural Airway  Additional Equipment:   Intra-op Plan:   Post-operative Plan:   Informed Consent: I have reviewed the patients History and Physical, chart, labs and discussed the procedure including the risks, benefits and alternatives for the proposed anesthesia with the patient or authorized representative who has indicated his/her understanding and acceptance.     Plan Discussed with: Anesthesiologist, CRNA and Surgeon  Anesthesia Plan Comments:         Anesthesia Quick Evaluation

## 2015-01-05 ENCOUNTER — Encounter (HOSPITAL_COMMUNITY): Payer: Self-pay | Admitting: Neonatal-Perinatal Medicine

## 2015-01-05 ENCOUNTER — Encounter (HOSPITAL_COMMUNITY)
Admission: AD | Disposition: A | Discharge status: Home / Self Care | Payer: Self-pay | Source: Ambulatory Visit | Attending: Obstetrics and Gynecology

## 2015-01-05 ENCOUNTER — Inpatient Hospital Stay (HOSPITAL_COMMUNITY): Payer: Medicaid Other | Admitting: Anesthesiology

## 2015-01-05 DIAGNOSIS — Z3A39 39 weeks gestation of pregnancy: Secondary | ICD-10-CM | POA: Diagnosis not present

## 2015-01-05 DIAGNOSIS — M549 Dorsalgia, unspecified: Secondary | ICD-10-CM | POA: Diagnosis present

## 2015-01-05 DIAGNOSIS — B009 Herpesviral infection, unspecified: Secondary | ICD-10-CM | POA: Diagnosis present

## 2015-01-05 DIAGNOSIS — K219 Gastro-esophageal reflux disease without esophagitis: Secondary | ICD-10-CM | POA: Diagnosis present

## 2015-01-05 DIAGNOSIS — O9962 Diseases of the digestive system complicating childbirth: Secondary | ICD-10-CM | POA: Diagnosis present

## 2015-01-05 DIAGNOSIS — O99214 Obesity complicating childbirth: Secondary | ICD-10-CM | POA: Diagnosis present

## 2015-01-05 DIAGNOSIS — R51 Headache: Secondary | ICD-10-CM | POA: Diagnosis present

## 2015-01-05 DIAGNOSIS — O9852 Other viral diseases complicating childbirth: Secondary | ICD-10-CM | POA: Diagnosis present

## 2015-01-05 DIAGNOSIS — Z6841 Body Mass Index (BMI) 40.0 and over, adult: Secondary | ICD-10-CM | POA: Diagnosis not present

## 2015-01-05 DIAGNOSIS — A6000 Herpesviral infection of urogenital system, unspecified: Secondary | ICD-10-CM | POA: Diagnosis present

## 2015-01-05 LAB — COMPREHENSIVE METABOLIC PANEL
ALBUMIN: 2.9 g/dL — AB (ref 3.5–5.0)
ALT: 23 U/L (ref 14–54)
ANION GAP: 9 (ref 5–15)
AST: 24 U/L (ref 15–41)
Alkaline Phosphatase: 111 U/L (ref 38–126)
BUN: 12 mg/dL (ref 6–20)
CHLORIDE: 108 mmol/L (ref 101–111)
CO2: 19 mmol/L — AB (ref 22–32)
Calcium: 9.1 mg/dL (ref 8.9–10.3)
Creatinine, Ser: 0.52 mg/dL (ref 0.44–1.00)
GFR calc non Af Amer: >60 mL/min (ref >60)
GLUCOSE: 95 mg/dL (ref 65–99)
POTASSIUM: 3.7 mmol/L (ref 3.5–5.1)
SODIUM: 136 mmol/L (ref 135–145)
Total Bilirubin: 0.5 mg/dL (ref 0.3–1.2)
Total Protein: 6 g/dL — ABNORMAL LOW (ref 6.5–8.1)

## 2015-01-05 LAB — CBC
HEMATOCRIT: 35.6 % — AB (ref 36.0–46.0)
HEMOGLOBIN: 12.3 g/dL (ref 12.0–15.0)
MCH: 30.8 pg (ref 26.0–34.0)
MCHC: 34.6 g/dL (ref 30.0–36.0)
MCV: 89 fL (ref 78.0–100.0)
Platelets: 225 10*3/uL (ref 150–400)
RBC: 4 MIL/uL (ref 3.87–5.11)
RDW: 13.9 % (ref 11.5–15.5)
WBC: 9 10*3/uL (ref 4.0–10.5)

## 2015-01-05 LAB — TYPE AND SCREEN
ABO/RH(D): A POS
Antibody Screen: NEGATIVE

## 2015-01-05 LAB — PROTEIN / CREATININE RATIO, URINE
CREATININE, URINE: 201 mg/dL
PROTEIN CREATININE RATIO: 0.08 mg/mg{creat} (ref 0.00–0.15)
TOTAL PROTEIN, URINE: 16 mg/dL

## 2015-01-05 LAB — URIC ACID: Uric Acid, Serum: 3.4 mg/dL (ref 2.3–6.6)

## 2015-01-05 LAB — RPR: RPR: NONREACTIVE

## 2015-01-05 LAB — LACTATE DEHYDROGENASE: LDH: 140 U/L (ref 98–192)

## 2015-01-05 SURGERY — Surgical Case
Anesthesia: Spinal

## 2015-01-05 MED ORDER — LANOLIN HYDROUS EX OINT: 1.0000 "application " | TOPICAL_OINTMENT | CUTANEOUS | Status: DC | PRN

## 2015-01-05 MED ORDER — NALBUPHINE HCL 10 MG/ML IJ SOLN: 5.0000 mg | Freq: Once | INTRAMUSCULAR | Status: DC | PRN

## 2015-01-05 MED ORDER — ONDANSETRON HCL 4 MG/2ML IJ SOLN
INTRAMUSCULAR | Status: DC | PRN
  Administered 2015-01-05: 4 mg via INTRAVENOUS

## 2015-01-05 MED ORDER — MORPHINE SULFATE (PF) 0.5 MG/ML IJ SOLN
INTRAMUSCULAR | Status: DC | PRN
  Administered 2015-01-05: .2 ug via EPIDURAL

## 2015-01-05 MED ORDER — PHENYLEPHRINE 8 MG IN D5W 100 ML (0.08MG/ML) PREMIX OPTIME
INJECTION | INTRAVENOUS | Status: AC
  Filled 2015-01-05: qty 100

## 2015-01-05 MED ORDER — FENTANYL CITRATE (PF) 100 MCG/2ML IJ SOLN
INTRAMUSCULAR | Status: DC | PRN
  Administered 2015-01-05: 20 ug via INTRATHECAL

## 2015-01-05 MED ORDER — DIPHENHYDRAMINE HCL 50 MG/ML IJ SOLN: 12.5000 mg | INTRAMUSCULAR | Status: DC | PRN

## 2015-01-05 MED ORDER — SODIUM CHLORIDE 0.9 % IJ SOLN: 3.0000 mL | INTRAMUSCULAR | Status: DC | PRN

## 2015-01-05 MED ORDER — BUPIVACAINE-EPINEPHRINE 0.5% -1:200000 IJ SOLN
INTRAMUSCULAR | Status: DC | PRN
  Administered 2015-01-05: 10 mL

## 2015-01-05 MED ORDER — OXYCODONE-ACETAMINOPHEN 5-325 MG PO TABS
2.0000 | ORAL_TABLET | ORAL | Status: DC | PRN
  Filled 2015-01-05: qty 2

## 2015-01-05 MED ORDER — IBUPROFEN 600 MG PO TABS
600.0000 mg | ORAL_TABLET | Freq: Four times a day (QID) | ORAL | Status: DC
  Administered 2015-01-05 – 2015-01-08 (×12): 600 mg via ORAL
  Filled 2015-01-05 (×12): qty 1

## 2015-01-05 MED ORDER — VALACYCLOVIR HCL 500 MG PO TABS
1000.0000 mg | ORAL_TABLET | Freq: Two times a day (BID) | ORAL | Status: DC
  Administered 2015-01-05 – 2015-01-08 (×7): 1000 mg via ORAL
  Filled 2015-01-05 (×10): qty 2

## 2015-01-05 MED ORDER — MEASLES, MUMPS & RUBELLA VAC ~~LOC~~ INJ
0.5000 mL | INJECTION | Freq: Once | SUBCUTANEOUS | Status: DC
  Filled 2015-01-05: qty 0.5

## 2015-01-05 MED ORDER — DEXTROSE 5 % IV SOLN
INTRAVENOUS | Status: AC
  Filled 2015-01-05: qty 3000

## 2015-01-05 MED ORDER — KETOROLAC TROMETHAMINE 30 MG/ML IJ SOLN
30.0000 mg | Freq: Once | INTRAMUSCULAR | Status: AC
  Administered 2015-01-05: 30 mg via INTRAVENOUS
  Filled 2015-01-05: qty 1

## 2015-01-05 MED ORDER — SIMETHICONE 80 MG PO CHEW
80.0000 mg | CHEWABLE_TABLET | ORAL | Status: DC | PRN
  Filled 2015-01-05: qty 1

## 2015-01-05 MED ORDER — MEPERIDINE HCL 25 MG/ML IJ SOLN: 6.2500 mg | INTRAMUSCULAR | Status: DC | PRN

## 2015-01-05 MED ORDER — OXYTOCIN 10 UNIT/ML IJ SOLN
40.0000 [IU] | INTRAVENOUS | Status: DC | PRN
  Administered 2015-01-05: 40 [IU] via INTRAVENOUS

## 2015-01-05 MED ORDER — MORPHINE SULFATE (PF) 0.5 MG/ML IJ SOLN
INTRAMUSCULAR | Status: AC
  Filled 2015-01-05: qty 10

## 2015-01-05 MED ORDER — NALBUPHINE HCL 10 MG/ML IJ SOLN: 5.0000 mg | INTRAMUSCULAR | Status: DC | PRN

## 2015-01-05 MED ORDER — BUPIVACAINE IN DEXTROSE 0.75-8.25 % IT SOLN
INTRATHECAL | Status: DC | PRN
  Administered 2015-01-05: 12 mg via INTRATHECAL

## 2015-01-05 MED ORDER — NALOXONE HCL 2 MG/2ML IJ SOSY
1.0000 ug/kg/h | PREFILLED_SYRINGE | INTRAVENOUS | Status: DC | PRN
  Filled 2015-01-05: qty 2

## 2015-01-05 MED ORDER — OXYTOCIN 10 UNIT/ML IJ SOLN
INTRAMUSCULAR | Status: AC
  Filled 2015-01-05: qty 4

## 2015-01-05 MED ORDER — NALOXONE HCL 0.4 MG/ML IJ SOLN: 0.4000 mg | INTRAMUSCULAR | Status: DC | PRN

## 2015-01-05 MED ORDER — MENTHOL 3 MG MT LOZG
1.0000 | LOZENGE | OROMUCOSAL | Status: DC | PRN
  Filled 2015-01-05: qty 9

## 2015-01-05 MED ORDER — OXYTOCIN 40 UNITS IN LACTATED RINGERS INFUSION - SIMPLE MED
INTRAVENOUS | Status: AC
  Filled 2015-01-05: qty 1000

## 2015-01-05 MED ORDER — FENTANYL CITRATE (PF) 100 MCG/2ML IJ SOLN: 25.0000 ug | INTRAMUSCULAR | Status: DC | PRN

## 2015-01-05 MED ORDER — ONDANSETRON HCL 4 MG/2ML IJ SOLN
4.0000 mg | Freq: Three times a day (TID) | INTRAMUSCULAR | Status: DC | PRN
  Administered 2015-01-05: 4 mg via INTRAVENOUS
  Filled 2015-01-05: qty 2

## 2015-01-05 MED ORDER — DIPHENHYDRAMINE HCL 25 MG PO CAPS
25.0000 mg | ORAL_CAPSULE | ORAL | Status: DC | PRN
  Filled 2015-01-05: qty 1

## 2015-01-05 MED ORDER — PHENYLEPHRINE 8 MG IN D5W 100 ML (0.08MG/ML) PREMIX OPTIME
INJECTION | INTRAVENOUS | Status: DC | PRN
  Administered 2015-01-05: 60 ug/min via INTRAVENOUS

## 2015-01-05 MED ORDER — SIMETHICONE 80 MG PO CHEW
80.0000 mg | CHEWABLE_TABLET | Freq: Three times a day (TID) | ORAL | Status: DC
  Administered 2015-01-05 – 2015-01-08 (×8): 80 mg via ORAL
  Filled 2015-01-05 (×14): qty 1

## 2015-01-05 MED ORDER — ZOLPIDEM TARTRATE 5 MG PO TABS: 5.0000 mg | ORAL_TABLET | Freq: Every evening | ORAL | Status: DC | PRN

## 2015-01-05 MED ORDER — MEDROXYPROGESTERONE ACETATE 150 MG/ML IM SUSP: 150.0000 mg | INTRAMUSCULAR | Status: DC | PRN

## 2015-01-05 MED ORDER — ACETAMINOPHEN 325 MG PO TABS: 650.0000 mg | ORAL_TABLET | ORAL | Status: DC | PRN

## 2015-01-05 MED ORDER — WITCH HAZEL-GLYCERIN EX PADS: 1.0000 "application " | MEDICATED_PAD | CUTANEOUS | Status: DC | PRN

## 2015-01-05 MED ORDER — LACTATED RINGERS IV SOLN
INTRAVENOUS | Status: DC
  Administered 2015-01-06: 06:00:00 via INTRAVENOUS

## 2015-01-05 MED ORDER — SIMETHICONE 80 MG PO CHEW
80.0000 mg | CHEWABLE_TABLET | ORAL | Status: DC
  Administered 2015-01-05 – 2015-01-07 (×3): 80 mg via ORAL
  Filled 2015-01-05 (×5): qty 1

## 2015-01-05 MED ORDER — DIPHENHYDRAMINE HCL 25 MG PO CAPS
25.0000 mg | ORAL_CAPSULE | Freq: Four times a day (QID) | ORAL | Status: DC | PRN
  Filled 2015-01-05: qty 1

## 2015-01-05 MED ORDER — FENTANYL CITRATE (PF) 100 MCG/2ML IJ SOLN
INTRAMUSCULAR | Status: AC
  Filled 2015-01-05: qty 2

## 2015-01-05 MED ORDER — SCOPOLAMINE 1 MG/3DAYS TD PT72
MEDICATED_PATCH | TRANSDERMAL | Status: DC | PRN
  Administered 2015-01-05: 1 via TRANSDERMAL

## 2015-01-05 MED ORDER — DIBUCAINE 1 % RE OINT
1.0000 "application " | TOPICAL_OINTMENT | RECTAL | Status: DC | PRN
  Filled 2015-01-05: qty 28

## 2015-01-05 MED ORDER — OXYTOCIN 40 UNITS IN LACTATED RINGERS INFUSION - SIMPLE MED
62.5000 mL/h | INTRAVENOUS | Status: AC
  Administered 2015-01-05: 62.5 mL/h via INTRAVENOUS

## 2015-01-05 MED ORDER — PRENATAL MULTIVITAMIN CH
1.0000 | ORAL_TABLET | Freq: Every day | ORAL | Status: DC
  Administered 2015-01-06 – 2015-01-08 (×3): 1 via ORAL
  Filled 2015-01-05 (×5): qty 1

## 2015-01-05 MED ORDER — SENNOSIDES-DOCUSATE SODIUM 8.6-50 MG PO TABS
2.0000 | ORAL_TABLET | ORAL | Status: DC
  Administered 2015-01-05 – 2015-01-07 (×3): 2 via ORAL
  Filled 2015-01-05 (×6): qty 2

## 2015-01-05 MED ORDER — SCOPOLAMINE 1 MG/3DAYS TD PT72
MEDICATED_PATCH | TRANSDERMAL | Status: AC
  Filled 2015-01-05: qty 1

## 2015-01-05 MED ORDER — PHENYLEPHRINE HCL 10 MG/ML IJ SOLN
INTRAMUSCULAR | Status: DC | PRN
  Administered 2015-01-05: 80 ug via INTRAVENOUS

## 2015-01-05 MED ORDER — OXYCODONE-ACETAMINOPHEN 5-325 MG PO TABS
1.0000 | ORAL_TABLET | ORAL | Status: DC | PRN
  Administered 2015-01-05 – 2015-01-08 (×9): 1 via ORAL
  Filled 2015-01-05 (×8): qty 1

## 2015-01-05 MED ORDER — TETANUS-DIPHTH-ACELL PERTUSSIS 5-2.5-18.5 LF-MCG/0.5 IM SUSP
0.5000 mL | Freq: Once | INTRAMUSCULAR | Status: DC
  Filled 2015-01-05: qty 0.5

## 2015-01-05 MED ORDER — BUPIVACAINE HCL (PF) 0.5 % IJ SOLN: INTRAMUSCULAR | Status: DC | PRN

## 2015-01-05 MED ORDER — ONDANSETRON HCL 4 MG/2ML IJ SOLN
INTRAMUSCULAR | Status: AC
  Filled 2015-01-05: qty 2

## 2015-01-05 SURGICAL SUPPLY — 42 items
ATCH SMKEVC FLXB CAUT HNDSWH (FILTER) IMPLANT
CLAMP CORD UMBIL (MISCELLANEOUS) IMPLANT
CLOTH BEACON ORANGE TIMEOUT ST (SAFETY) ×2 IMPLANT
CONTAINER PREFILL 10% NBF 15ML (MISCELLANEOUS) IMPLANT
DRAIN JACKSON PRT FLT 7MM (DRAIN) IMPLANT
DRAPE SHEET LG 3/4 BI-LAMINATE (DRAPES) IMPLANT
DRSG OPSITE POSTOP 4X10 (GAUZE/BANDAGES/DRESSINGS) ×2 IMPLANT
DURAPREP 26ML APPLICATOR (WOUND CARE) ×2 IMPLANT
ELECT REM PT RETURN 9FT ADLT (ELECTROSURGICAL) ×2
ELECTRODE REM PT RTRN 9FT ADLT (ELECTROSURGICAL) ×1 IMPLANT
EVACUATOR SILICONE 100CC (DRAIN) IMPLANT
EVACUATOR SMOKE ACCUVAC VALLEY (FILTER) ×1
EXTRACTOR VACUUM M CUP 4 TUBE (SUCTIONS) IMPLANT
GLOVE BIOGEL PI IND STRL 7.0 (GLOVE) ×1 IMPLANT
GLOVE BIOGEL PI IND STRL 8.5 (GLOVE) ×1 IMPLANT
GLOVE BIOGEL PI INDICATOR 7.0 (GLOVE) ×1
GLOVE BIOGEL PI INDICATOR 8.5 (GLOVE) ×1
GLOVE ECLIPSE 8.0 STRL XLNG CF (GLOVE) ×4 IMPLANT
GOWN STRL REUS W/TWL LRG LVL3 (GOWN DISPOSABLE) ×6 IMPLANT
KIT ABG SYR 3ML LUER SLIP (SYRINGE) IMPLANT
NDL HYPO 25X5/8 SAFETYGLIDE (NEEDLE) IMPLANT
NEEDLE HYPO 22GX1.5 SAFETY (NEEDLE) ×2 IMPLANT
NEEDLE HYPO 25X5/8 SAFETYGLIDE (NEEDLE) IMPLANT
PACK C SECTION WH (CUSTOM PROCEDURE TRAY) ×2 IMPLANT
PAD OB MATERNITY 4.3X12.25 (PERSONAL CARE ITEMS) ×2 IMPLANT
PENCIL SMOKE EVAC W/HOLSTER (ELECTROSURGICAL) ×2 IMPLANT
RINGERS IRRIG 1000ML POUR BTL (IV SOLUTION) ×2 IMPLANT
SPONGE GAUZE 4X4 12PLY (GAUZE/BANDAGES/DRESSINGS) ×1 IMPLANT
SUT MNCRL AB 3-0 PS2 27 (SUTURE) IMPLANT
SUT PLAIN 0 NONE (SUTURE) IMPLANT
SUT SILK 3 0 FS 1X18 (SUTURE) IMPLANT
SUT VIC AB 0 CT1 27 (SUTURE) ×4
SUT VIC AB 0 CT1 27XBRD ANBCTR (SUTURE) ×2 IMPLANT
SUT VIC AB 2-0 CTX 36 (SUTURE) ×4 IMPLANT
SUT VIC AB 3-0 CT1 27 (SUTURE) ×2
SUT VIC AB 3-0 CT1 TAPERPNT 27 (SUTURE) IMPLANT
SUT VIC AB 3-0 SH 27 (SUTURE)
SUT VIC AB 3-0 SH 27X BRD (SUTURE) IMPLANT
SYR CONTROL 10ML LL (SYRINGE) ×2 IMPLANT
TAPE CLOTH SURG 4X10 WHT LF (GAUZE/BANDAGES/DRESSINGS) ×1 IMPLANT
TOWEL OR 17X24 6PK STRL BLUE (TOWEL DISPOSABLE) ×2 IMPLANT
TRAY FOLEY CATH SILVER 14FR (SET/KITS/TRAYS/PACK) ×2 IMPLANT

## 2015-01-05 NOTE — Anesthesia Postprocedure Evaluation (Signed)
Anesthesia Post Note  Patient: Maricsa A Roll  Procedure(s) Performed: Procedure(s) (LRB): CESAREAN SECTION (N/A)  Patient location during evaluation: PACU Anesthesia Type: Spinal Level of consciousness: awake and alert and oriented Pain management: pain level controlled Vital Signs Assessment: post-procedure vital signs reviewed and stable Respiratory status: spontaneous breathing, nonlabored ventilation and respiratory function stable Cardiovascular status: blood pressure returned to baseline and stable Postop Assessment: no headache, no backache, spinal receding and no signs of nausea or vomiting Anesthetic complications: no    Last Vitals:  Filed Vitals:   01/05/15 0215 01/05/15 0230  BP: 118/62 111/58  Pulse: 79 80  Temp: 36.1 C   Resp: 11 17    Last Pain:  Filed Vitals:   01/05/15 0235  PainSc: 0-No pain                 Magdalynn Davilla A.

## 2015-01-05 NOTE — Anesthesia Procedure Notes (Signed)
Spinal Patient location during procedure: OR Start time: 01/05/2015 12:44 AM Staffing Anesthesiologist: Josephine Igo Performed by: anesthesiologist  Preanesthetic Checklist Completed: patient identified, site marked, surgical consent, pre-op evaluation, timeout performed, IV checked, risks and benefits discussed and monitors and equipment checked Spinal Block Patient position: sitting Prep: site prepped and draped and DuraPrep Patient monitoring: heart rate, cardiac monitor, continuous pulse ox and blood pressure Approach: midline Location: L3-4 Injection technique: single-shot Needle Needle type: Sprotte  Needle gauge: 24 G Needle length: 9 cm Needle insertion depth: 8 cm Assessment Sensory level: T4 Additional Notes Patient tolerated procedure well. Adequate sensory level.

## 2015-01-05 NOTE — Lactation Note (Signed)
This note was copied from the chart of [place]. Lactation Consultation Note  Patient Name: Rhonda Barron Today's Date: 01/05/2015 Reason for consult: Initial assessment   With this mom of a term baby, now 10 hours post partum. Mom had a c-section. She was not able to breast feed her first child due to painful latch. I assisted mom with latching the baby in football hold on her right breast. The baby latches well, but is on and off the breast. He has a stuffy nose, which could be why he is on and off. On exam, his tongue appears to have a mid posterior short frenulum, and this could be causing him to lose his latch.  I tried a 24 nipple shield, but he would not stay latched with shield. I then tried him in cross cradle hold to mom's left breast. He did better in this position, He was on and off, but doing better,  with some consistent visible swallows. Mom has lots of easily hand expressed colostrum.  Mom agreed to me setting up a DEP to protect her milk supply. She is very tired, and nauseas at this time, so I advised mom to rest after breast feeding, and to call for assistance with pumping later, when she feels better.  Basic breastfeeding teaching done from the Baby and Me book, and lactation services also reviewed. Mom knows to call for questions/concerns.     Maternal Data Formula Feeding for Exclusion: No Has patient been taught Hand Expression?: Yes Does the patient have breastfeeding experience prior to this delivery?: Yes  Feeding Feeding Type: Breast Fed Length of feed: 20 min  LATCH Score/Interventions Latch: Repeated attempts needed to sustain latch, nipple held in mouth throughout feeding, stimulation needed to elicit sucking reflex. (bab on and off the breast - he has a stuffy nose, and a possible posterior short mid tongue frenulum) Intervention(s): Adjust position;Assist with latch;Breast compression  Audible Swallowing: Spontaneous and intermittent  Type  of Nipple: Everted at rest and after stimulation (mom has what appears to be double nipples on both breasts)  Comfort (Breast/Nipple): Soft / non-tender     Hold (Positioning): Assistance needed to correctly position infant at breast and maintain latch. Intervention(s): Breastfeeding basics reviewed;Support Pillows;Position options;Skin to skin  LATCH Score: 8  Lactation Tools Discussed/Used Tools: Nipple Shields Nipple shield size: 24;Other (comment) (baby would not stay latched with shield)   Consult Status Consult Status: Follow-up Date: 01/06/15 Follow-up type: In-patient    Tonna Corner 01/05/2015, 12:32 PM

## 2015-01-05 NOTE — Op Note (Signed)
OPERATIVE NOTE  Patient's Name: [name]  Date of Birth: 22-Sep-1985  Medical Records Number: [id number]  Date of Operation: 01/05/2015  Preoperative diagnosis:  [redacted]w[redacted]d weeks gestation  Active HSV  Obesity  Postoperative diagnosis:  [redacted]w[redacted]d weeks gestation  Active HSV  Obesity  Procedure:  Primary low transverse cesarean section  Surgeon:  Gildardo Cranker, M.D.  Assistant:  Gavin Pound, certified nurse midwife  Anesthesia:  Regional  Disposition:  [name] is a 29 y.o. female, [id number], who presents at [redacted]w[redacted]d weeks gestation. The patient has been followed at the [facility] obstetrics and gynecology division of [facility]. She has the above mentioned diagnosis. She understands the indications for her procedure and she accepts the risk of, but not limited to, anesthetic complications, bleeding, infections, and possible damage to the surrounding organs.  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  The Apgar scores were 10/10 . The uterus, fallopian tubes, and ovaries were normal for the gravid state.  Procedure:  The patient was taken to the operating room where a spinal anesthetic was given. The patient's abdomen was prepped with Chloraprep. The perineum was prepped with betadine. A Foley catheter was placed in the bladder. The patient was sterilely draped. A "timeout" was performed which properly identified the patient and the correct operative procedure. The lower abdomen was injected with half percent Marcaine with epinephrine. A low transverse incision was made in the abdomen and carried sharply through the subcutaneous tissue, the fascia, and the anterior peritoneum. An incision was made in the lower uterine segment. The incision was extended in a low transverse fashion. The membranes were ruptured. The fetal head was delivered without difficulty. The mouth and nose were suctioned. The remainder of  the infant was then delivered. The cord was clamped and cut. The infant was handed to the awaiting pediatric team. The placenta was removed. The uterine cavity was cleaned of amniotic fluid, clotted blood, and membranes. The uterine incision was closed using a running locking suture of 2-0 Vicryl. An imbricating suture of 2-0 Vicryl was placed. The pelvis was vigorously irrigated. Hemostasis was adequate. The anterior peritoneum and the abdominal musculature were closed using 2-0 Vicryl. The fascia was closed using a running suture of 0 Vicryl followed by 3 interrupted sutures of 0 Vicryl. The subcutaneous layer was closed using interrupted sutures of 2-0 Vicryl. The skin was reapproximated using a subcuticular suture of 3-0 Monocryl. Sponge, needle, and instrument counts were correct on 2 occasions. The estimated blood loss for the procedure was 900 cc. The patient tolerated her procedure well. She was transported to the recovery room in stable condition. The infant was placed skin to skin. The placenta was sent to pathology.  Gildardo Cranker, M.D.

## 2015-01-05 NOTE — H&P (Signed)
CS for active HSV. R and B reviewed. She declines BTL. AVS

## 2015-01-05 NOTE — Progress Notes (Signed)
Subjective: Postpartum Day 0: Cesarean Delivery due to HSV 1&2 positive with active outbreak Patient up ad lib, reports no syncope or dizziness. Feeding:  breast Contraceptive plan:  No discussed  Objective: Vital signs in last 24 hours: Temp:  [96.9 F (36.1 C)-97.9 F (36.6 C)] 97.8 F (36.6 C) (12/23 0615) Pulse Rate:  [64-84] 70 (12/23 0615) Resp:  [10-22] 20 (12/23 0615) BP: (102-132)/(49-69) 109/49 mmHg (12/23 0615) SpO2:  [97 %-99 %] 98 % (12/23 0615) Weight:  [267 lb 12.8 oz (121.473 kg)] 267 lb 12.8 oz (121.473 kg) (12/22 2257)  Physical Exam:  General: alert and cooperative Lochia: appropriate Uterine Fundus: firm Abdomen:  + bowel sounds, non distended Incision: healing well  Honeycomb dressing CDI DVT Evaluation: No evidence of DVT seen on physical exam. Homan's sign: Negative   Recent Labs  01/05/15 0022  HGB 12.3  HCT 35.6*  WBC 9.0   Orthostatics stable.  Assessment: Status post Cesarean section day 0. Doing well postoperatively.  pressuredressing in place, no significant drainage Circumcision:    Plan: Continue current care. Lactation consult    Gaige Sebo, CNM, MSN 01/05/2015. 11:51 AM

## 2015-01-05 NOTE — Transfer of Care (Signed)
Immediate Anesthesia Transfer of Care Note  Patient: Rhonda Barron  Procedure(s) Performed: Procedure(s): CESAREAN SECTION (N/A)  Patient Location: PACU  Anesthesia Type:Spinal  Level of Consciousness: awake, alert  and oriented  Airway & Oxygen Therapy: Patient Spontanous Breathing  Post-op Assessment: Report given to RN and Post -op Vital signs reviewed and stable  Post vital signs: Reviewed and stable  Last Vitals:  Filed Vitals:   01/04/15 2305  BP: 132/69  Pulse: 78  Temp: 36.6 C  Resp: 18    Complications: No apparent anesthesia complications

## 2015-01-06 LAB — CBC
HEMATOCRIT: 30.8 % — AB (ref 36.0–46.0)
Hemoglobin: 10.6 g/dL — ABNORMAL LOW (ref 12.0–15.0)
MCH: 31.1 pg (ref 26.0–34.0)
MCHC: 34.4 g/dL (ref 30.0–36.0)
MCV: 90.3 fL (ref 78.0–100.0)
PLATELETS: 209 10*3/uL (ref 150–400)
RBC: 3.41 MIL/uL — ABNORMAL LOW (ref 3.87–5.11)
RDW: 14.3 % (ref 11.5–15.5)
WBC: 10.4 10*3/uL (ref 4.0–10.5)

## 2015-01-06 NOTE — Lactation Note (Signed)
This note was copied from the chart of [name]. Lactation Consultation Note  Assisted mother with attaching baby to the left breast.  Baby did not suckle well so suck evaluation was performed.  Jayshawn initially bit down on gloved finger but then relaxed and suckled well.  He was placed back on the breast and his feeding improved.  He needs more practice at the breast. Encouraged mom to cup or spoon feed rather than BO feed to support BF.  SHe agreed. Bedside pump was reviewed and mom was instructed to post pump at least 6 times in 24 hours related to NS use. Follow-up tomorrow or sooner if needed.  Patient Name: Rhonda Barron Dust [id number] Date: 01/06/2015 Reason for consult: Follow-up assessment   Maternal Data    Feeding Feeding Type: Breast Fed Length of feed: 15 min  LATCH Score/Interventions Latch: Repeated attempts needed to sustain latch, nipple held in mouth throughout feeding, stimulation needed to elicit sucking reflex. Intervention(s): Adjust position;Assist with latch  Audible Swallowing: A few with stimulation Intervention(s): Skin to skin  Type of Nipple: Flat  Comfort (Breast/Nipple): Soft / non-tender     Hold (Positioning): Assistance needed to correctly position infant at breast and maintain latch. Intervention(s): Breastfeeding basics reviewed;Support Pillows;Skin to skin  LATCH Score: 6  Lactation Tools Discussed/Used Tools: Nipple Shields Nipple shield size: 24   Consult Status      Van Clines 01/06/2015, 3:23 PM

## 2015-01-06 NOTE — Progress Notes (Signed)
GRACILYN LAFORCE [id number]  Subjective: Postpartum Day 1: Primary LTC/S due to active HSV outbreak. Patient up ad lib, reports no syncope or dizziness. Feeding:  Breast and bottle. Contraceptive plan:  Had planned BTL, but providers unable to find BTL consent at time to admission.  May want BTL prior to 6 weeks pp.  Baby remains in room with mom--nursery personnel observing closely for any s/s of HSV infection in newborn.  Objective: Temp:  [97.3 F (36.3 C)-99.2 F (37.3 C)] 97.3 F (36.3 C) (12/24 0554) Pulse Rate:  [74-81] 77 (12/24 0554) Resp:  [18-20] 18 (12/24 0554) BP: (101-108)/(46-60) 102/60 mmHg (12/24 0554) SpO2:  [96 %-98 %] 98 % (12/24 0300)  CBC Latest Ref Rng 01/06/2015 01/05/2015 12/24/2010  WBC 4.0 - 10.5 K/uL 10.4 9.0 14.5(H)  Hemoglobin 12.0 - 15.0 g/dL 10.6(L) 12.3 10.6(L)  Hematocrit 36.0 - 46.0 % 30.8(L) 35.6(L) 30.9(L)  Platelets 150 - 400 K/uL 209 225 167     Physical Exam:  General: alert Lochia: appropriate Uterine Fundus: firm Abdomen:  + bowel sounds Incision: Pressure dressing CDI DVT Evaluation: No evidence of DVT seen on physical exam. Negative Homan's sign.   Assessment/Plan: Status post cesarean delivery, day 1. Stable Continue current care.  Donnel Saxon MSN, CNM 01/06/2015, 10:36 AM

## 2015-01-07 MED ORDER — FERROUS SULFATE 325 (65 FE) MG PO TABS: 325.0000 mg | ORAL_TABLET | Freq: Two times a day (BID) | ORAL | Status: DC

## 2015-01-07 MED ORDER — IBUPROFEN 600 MG PO TABS
600.0000 mg | ORAL_TABLET | Freq: Four times a day (QID) | ORAL | Status: DC
Start: 2015-01-07 — End: 2017-03-28

## 2015-01-07 MED ORDER — FERROUS SULFATE 325 (65 FE) MG PO TABS
325.0000 mg | ORAL_TABLET | Freq: Two times a day (BID) | ORAL | Status: DC
  Administered 2015-01-07 – 2015-01-08 (×2): 325 mg via ORAL
  Filled 2015-01-07 (×2): qty 1

## 2015-01-07 MED ORDER — OXYCODONE-ACETAMINOPHEN 5-325 MG PO TABS: 1.0000 | ORAL_TABLET | ORAL | Status: DC | PRN

## 2015-01-07 NOTE — Progress Notes (Signed)
Subjective: Postpartum Day 2: Cesarean Delivery due to active HSV Patient up ad lib, reports no syncope or dizziness. +Flatus, +BS Feeding:  breast Contraceptive plan:  BTL in PP  Objective: Vital signs in last 24 hours: Temp:  [98.4 F (36.9 C)-98.8 F (37.1 C)] 98.4 F (36.9 C) (12/25 0601) Pulse Rate:  [76-78] 76 (12/25 0601) Resp:  [18-20] 20 (12/25 0601) BP: (123-144)/(55-71) 144/71 mmHg (12/25 0601)  Physical Exam:  General: alert and cooperative Lochia: appropriate Uterine Fundus: firm Abdomen:  + bowel sounds, non distended Incision: healing well  Honeycomb dressing CDI DVT Evaluation: No evidence of DVT seen on physical exam. Homan's sign: Negative   Recent Labs  01/05/15 0022 01/06/15 0605  HGB 12.3 10.6*  HCT 35.6* 30.8*  WBC 9.0 10.4   Orthostatics stable.  Assessment: Status post Cesarean section day 2. Doing well postoperatively.  Pressure dressing in place, no significant drainage Remove outer dressing Anemia - hemodynamicly stable.   Circumcision: declines   Plan: Continue current care. Plan for discharge tomorrow Iron supplement   Joby Hershkowitz, CNM, MSN 01/07/2015. 9:33 AM

## 2015-01-07 NOTE — Lactation Note (Signed)
This note was copied from the chart of [place]. Lactation Consultation Note Follow up visit at 45 hours of age.  Mom reports having pain and only offering formula in bottles.  Mom is not planning to continue pumping.  Mom denies further needs at this time.    Patient Name: Rhonda Barron [id number] Date: 01/07/2015 Reason for consult: Follow-up assessment   Maternal Data    Feeding Feeding Type: Formula  LATCH Score/Interventions                      Lactation Tools Discussed/Used     Consult Status Consult Status: PRN    Justice Britain 01/07/2015, 8:32 PM

## 2015-01-07 NOTE — Discharge Summary (Signed)
OB Discharge Summary  Patient Name: Rhonda Barron DOB: 09-12-1985 MRN: [id number]  Date of admission: 01/04/2015 Delivering MD: Ena Dawley   Date of discharge: 01/07/2015  Admitting diagnosis: 39w ha, ctx, back pain, pressure Intrauterine pregnancy: [redacted]w[redacted]d    Secondary diagnosis:Active Problems:   HSV (herpes simplex virus) infection  Additional problems:     Discharge diagnosis: Term Pregnancy Delivered and HSV+                                                                     Post partum procedures:BTL in PP  Augmentation: n/a  Complications: active herpes  Hospital course:  Onset of Labor With Unplanned C/S  29y.o. yo [id number] admitted in Latent Labor on 01/04/2015. . Membrane Rupture Time/Date: 8:00 PM ,01/04/2015   The patient went for cesarean section due to active HSV+, and delivered a Viable infant,01/05/2015  Details of operation can be found in separate operative note. Patient had an uncomplicated postpartum course.  She is ambulating,tolerating a regular diet, passing flatus, and urinating well.  Patient is discharged home in stable condition No discharge date for patient encounter.. Marland Kitchen Physical exam  Filed Vitals:   01/06/15 0300 01/06/15 0554 01/06/15 1810 01/07/15 0601  BP: 108/46 102/60 123/55 144/71  Pulse: 81 77 78 76  Temp: 98.7 F (37.1 C) 97.3 F (36.3 C) 98.8 F (37.1 C) 98.4 F (36.9 C)  TempSrc: Oral Oral Oral Oral  Resp: _0 Height:      Weight:      SpO2: 98%      General: alert and cooperative Lochia: appropriate Uterine Fundus: firm Incision: Healing well with no significant drainage DVT Evaluation: No evidence of DVT seen on physical exam. Labs: Lab Results  Component Value Date   WBC 10.4 01/06/2015   HGB 10.6* 01/06/2015   HCT 30.8* 01/06/2015   MCV 90.3 01/06/2015   PLT 209 01/06/2015   CMP Latest Ref Rng 01/05/2015  Glucose 65 - 99 mg/dL 95  BUN 6 - 20 mg/dL 12  Creatinine 0.44 - 1.00  mg/dL 0.52  Sodium 135 - 145 mmol/L 136  Potassium 3.5 - 5.1 mmol/L 3.7  Chloride 101 - 111 mmol/L 108  CO2 22 - 32 mmol/L 19(L)  Calcium 8.9 - 10.3 mg/dL 9.1  Total Protein 6.5 - 8.1 g/dL 6.0(L)  Total Bilirubin 0.3 - 1.2 mg/dL 0.5  Alkaline Phos 38 - 126 U/L 111  AST 15 - 41 U/L 24  ALT 14 - 54 U/L 23    Discharge instruction: per After Visit Summary and "Baby and Me Booklet".  Medications:  Current facility-administered medications:  .  acetaminophen (TYLENOL) tablet 650 mg, 650 mg, Oral, Q4H PRN, ArEna DawleyMD .  witch hazel-glycerin (TUCKS) pad 1 application, 1 application, Topical, PRN **AND** dibucaine (NUPERCAINAL) 1 % rectal ointment 1 application, 1 application, Rectal, PRN, ArEna DawleyMD .  diphenhydrAMINE (BENADRYL) injection 12.5 mg, 12.5 mg, Intravenous, Q4H PRN **OR** diphenhydrAMINE (BENADRYL) capsule 25 mg, 25 mg, Oral, Q4H PRN, MiJosephine IgoMD .  diphenhydrAMINE (BENADRYL) capsule 25 mg, 25 mg, Oral, Q6H PRN, ArEna DawleyMD .  ferrous sulfate tablet 325 mg, 325 mg, Oral, BID WC, Ousmane Seeman, CNM .  ibuprofen (ADVIL,MOTRIN) tablet 600 mg, 600 mg, Oral, 4 times per day, Ena Dawley, MD, 600 mg at 01/07/15 0517 .  lactated ringers infusion, , Intravenous, Continuous, Ena Dawley, MD, Last Rate: 125 mL/hr at 01/05/15 1756 .  lactated ringers infusion, , Intravenous, Continuous, Ena Dawley, MD, Last Rate: 125 mL/hr at 01/06/15 0533 .  lanolin ointment 1 application, 1 application, Topical, PRN, Ena Dawley, MD .  measles, mumps and rubella vaccine (MMR) injection 0.5 mL, 0.5 mL, Subcutaneous, Once, Ena Dawley, MD, 0.5 mL at 01/06/15 1000 .  medroxyPROGESTERone (DEPO-PROVERA) injection 150 mg, 150 mg, Intramuscular, Prior to discharge, Ena Dawley, MD .  menthol-cetylpyridinium (CEPACOL) lozenge 3 mg, 1 lozenge, Oral, Q2H PRN, Ena Dawley, MD .  nalbuphine (NUBAIN) injection 5 mg, 5 mg, Intravenous, Q4H PRN **OR**  nalbuphine (NUBAIN) injection 5 mg, 5 mg, Subcutaneous, Q4H PRN, Josephine Igo, MD .  nalbuphine (NUBAIN) injection 5 mg, 5 mg, Intravenous, Once PRN **OR** nalbuphine (NUBAIN) injection 5 mg, 5 mg, Subcutaneous, Once PRN, Josephine Igo, MD .  naloxone [facility]) 2 mg in dextrose 5 % 250 mL infusion, 1-4 mcg/kg/hr, Intravenous, Continuous PRN, Josephine Igo, MD .  naloxone [facility]) injection 0.4 mg, 0.4 mg, Intravenous, PRN **AND** sodium chloride 0.9 % injection 3 mL, 3 mL, Intravenous, PRN, Josephine Igo, MD .  ondansetron [facility]) injection 4 mg, 4 mg, Intravenous, Q8H PRN, Josephine Igo, MD, 4 mg at 01/05/15 1148 .  oxyCODONE-acetaminophen (PERCOCET/ROXICET) 5-325 MG per tablet 1 tablet, 1 tablet, Oral, Q4H PRN, Ena Dawley, MD, 1 tablet at 01/07/15 0048 .  oxyCODONE-acetaminophen (PERCOCET/ROXICET) 5-325 MG per tablet 2 tablet, 2 tablet, Oral, Q4H PRN, Ena Dawley, MD .  prenatal multivitamin tablet 1 tablet, 1 tablet, Oral, Q1200, Ena Dawley, MD, 1 tablet at 01/06/15 1132 .  senna-docusate (Senokot-S) tablet 2 tablet, 2 tablet, Oral, Q24H, Ena Dawley, MD, 2 tablet at 01/06/15 2332 .  simethicone (MYLICON) chewable tablet 80 mg, 80 mg, Oral, TID PC, Ena Dawley, MD, 80 mg at 01/06/15 1809 .  simethicone (MYLICON) chewable tablet 80 mg, 80 mg, Oral, Q24H, Ena Dawley, MD, 80 mg at 01/06/15 2333 .  simethicone (MYLICON) chewable tablet 80 mg, 80 mg, Oral, PRN, Ena Dawley, MD .  Tdap [facility]) injection 0.5 mL, 0.5 mL, Intramuscular, Once, Ena Dawley, MD, 0.5 mL at 01/06/15 0000 .  valACYclovir (VALTREX) tablet 1,000 mg, 1,000 mg, Oral, BID, Gavin Pound, CNM, 1,000 mg at 01/06/15 2152 .  zolpidem (AMBIEN) tablet 5 mg, 5 mg, Oral, QHS PRN, Ena Dawley, MD After Visit Meds:    Medication List    TAKE these medications        ferrous sulfate 325 (65 FE) MG tablet  Take 1 tablet (325 mg total) by mouth 2 (two) times daily with a meal.     ibuprofen  600 MG tablet  Commonly known as:  ADVIL,MOTRIN  Take 1 tablet (600 mg total) by mouth every 6 (six) hours.     oxyCODONE-acetaminophen 5-325 MG tablet  Commonly known as:  PERCOCET/ROXICET  Take 1 tablet by mouth every 4 (four) hours as needed (for pain scale 4-7).     PNV PO  Take 1 tablet by mouth daily.      ASK your doctor about these medications        acetaminophen 325 MG tablet  Commonly known as:  TYLENOL  Take 650 mg by mouth every 6 (six) hours as needed for mild pain or headache.        Diet: routine  diet  Activity: Advance as tolerated. Pelvic rest for 6 weeks.   Outpatient follow up:6 weeks Follow up Appt:No future appointments. Follow up visit: No Follow-up on file.  Postpartum contraception: Tubal Ligation in PP  Newborn Data: Live born female  Birth Weight: 8 lb 3.2 oz (3720 g) APGAR: 10, 10  Baby Feeding: Breast Disposition:home with mother   01/08/15 Tanique Matney, CNM     Care After Cesarean Delivery  Refer to this sheet in the next few weeks. These instructions provide you with information on caring for yourself after your procedure. Your caregiver may also give you specific instructions. Your treatment has been planned according to current medical practices, but problems sometimes occur. Call your caregiver if you have any problems or questions after you go home. HOME CARE INSTRUCTIONS  Only take over-the-counter or prescription medicines as directed by your caregiver.  Do not drink alcohol, especially if you are breastfeeding or taking medicine to relieve pain.  Do not chew or smoke tobacco.  Continue to use good perineal care. Good perineal care includes:  Wiping your perineum from front to back.  Keeping your perineum clean.  Check your cut (incision) daily for increased redness, drainage, swelling, or separation of skin.  Clean your incision gently with soap and water every day, and then pat it dry. If your caregiver says it  is okay, leave the incision uncovered. Use a bandage (dressing) if the incision is draining fluid or appears irritated. If the adhesive strips across the incision do not fall off within 7 days, carefully peel them off.  Hug a pillow when coughing or sneezing until your incision is healed. This helps to relieve pain.  Do not use tampons or douche until your caregiver says it is okay.  Shower, wash your hair, and take tub baths as directed by your caregiver.  Wear a well-fitting bra that provides breast support.  Limit wearing support panties or control-top hose.  Drink enough fluids to keep your urine clear or pale yellow.  Eat high-fiber foods such as whole grain cereals and breads, brown rice, beans, and fresh fruits and vegetables every day. These foods may help prevent or relieve constipation.  Resume activities such as climbing stairs, driving, lifting, exercising, or traveling as directed by your caregiver.  Talk to your caregiver about resuming sexual activities. This is dependent upon your risk of infection, your rate of healing, and your comfort and desire to resume sexual activity.  Try to have someone help you with your household activities and your newborn for at least a few days after you leave the hospital.  Rest as much as possible. Try to rest or take a nap when your newborn is sleeping.  Increase your activities gradually.  Keep all of your scheduled postpartum appointments. It is very important to keep your scheduled follow-up appointments. At these appointments, your caregiver will be checking to make sure that you are healing physically and emotionally. SEEK MEDICAL CARE IF:   You are passing large clots from your vagina. Save any clots to show your caregiver.  You have a foul smelling discharge from your vagina.  You have trouble urinating.  You are urinating frequently.  You have pain when you urinate.  You have a change in your bowel movements.  You have  increasing redness, pain, or swelling near your incision.  You have pus draining from your incision.  Your incision is separating.  You have painful, hard, or reddened breasts.  You have a severe  headache.  You have blurred vision or see spots.  You feel sad or depressed.  You have thoughts of hurting yourself or your newborn.  You have questions about your care, the care of your newborn, or medicines.  You are dizzy or lightheaded.  You have a rash.  You have pain, redness, or swelling at the site of the removed intravenous access (IV) tube.  You have nausea or vomiting.  You stopped breastfeeding and have not had a menstrual period within 12 weeks of stopping.  You are not breastfeeding and have not had a menstrual period within 12 weeks of delivery.  You have a fever. SEEK IMMEDIATE MEDICAL CARE IF:  You have persistent pain.  You have chest pain.  You have shortness of breath.  You faint.  You have leg pain.  You have stomach pain.  Your vaginal bleeding saturates 2 or more sanitary pads in 1 hour. MAKE SURE YOU:   Understand these instructions.  Will watch your condition.  Will get help right away if you are not doing well or get worse. Document Released: 09/21/2001 Document Revised: 09/24/2011 Document Reviewed: 08/27/2011 [facility] Patient Information 2014 [place].   Postpartum Depression and Baby Blues  The postpartum period begins right after the birth of a baby. During this time, there is often a great amount of joy and excitement. It is also a time of considerable changes in the life of the parent(s). Regardless of how many times a mother gives birth, each child brings new challenges and dynamics to the family. It is not unusual to have feelings of excitement accompanied by confusing shifts in moods, emotions, and thoughts. All mothers are at risk of developing postpartum depression or the "baby blues." These mood changes can occur right  after giving birth, or they may occur many months after giving birth. The baby blues or postpartum depression can be mild or severe. Additionally, postpartum depression can resolve rather quickly, or it can be a long-term condition. CAUSES Elevated hormones and their rapid decline are thought to be a main cause of postpartum depression and the baby blues. There are a number of hormones that radically change during and after pregnancy. Estrogen and progesterone usually decrease immediately after delivering your baby. The level of thyroid hormone and various cortisol steroids also rapidly drop. Other factors that play a major role in these changes include major life events and genetics.  RISK FACTORS If you have any of the following risks for the baby blues or postpartum depression, know what symptoms to watch out for during the postpartum period. Risk factors that may increase the likelihood of getting the baby blues or postpartum depression include: 1. Havinga personal or family history of depression. 2. Having depression while being pregnant. 3. Having premenstrual or oral contraceptive-associated mood issues. 4. Having exceptional life stress. 5. Having marital conflict. 6. Lacking a social support network. 7. Having a baby with special needs. 8. Having health problems such as diabetes. SYMPTOMS Baby blues symptoms include:  Brief fluctuations in mood, such as going from extreme happiness to sadness.  Decreased concentration.  Difficulty sleeping.  Crying spells, tearfulness.  Irritability.  Anxiety. Postpartum depression symptoms typically begin within the first month after giving birth. These symptoms include:  Difficulty sleeping or excessive sleepiness.  Marked weight loss.  Agitation.  Feelings of worthlessness.  Lack of interest in activity or food. Postpartum psychosis is a very concerning condition and can be dangerous. Fortunately, it is rare. Displaying any of the  following symptoms is cause for immediate medical attention. Postpartum psychosis symptoms include:  Hallucinations and delusions.  Bizarre or disorganized behavior.  Confusion or disorientation. DIAGNOSIS  A diagnosis is made by an evaluation of your symptoms. There are no medical or lab tests that lead to a diagnosis, but there are various questionnaires that a caregiver may use to identify those with the baby blues, postpartum depression, or psychosis. Often times, a screening tool called the [place] Postnatal Depression Scale is used to diagnose depression in the postpartum period.  TREATMENT The baby blues usually goes away on its own in 1 to 2 weeks. Social support is often all that is needed. You should be encouraged to get adequate sleep and rest. Occasionally, you may be given medicines to help you sleep.  Postpartum depression requires treatment as it can last several months or longer if it is not treated. Treatment may include individual or group therapy, medicine, or both to address any social, physiological, and psychological factors that may play a role in the depression. Regular exercise, a healthy diet, rest, and social support may also be strongly recommended.  Postpartum psychosis is more serious and needs treatment right away. Hospitalization is often needed. HOME CARE INSTRUCTIONS  Get as much rest as you can. Nap when the baby sleeps.  Exercise regularly. Some women find yoga and walking to be beneficial.  Eat a balanced and nourishing diet.  Do little things that you enjoy. Have a cup of tea, take a bubble bath, read your favorite magazine, or listen to your favorite music.  Avoid alcohol.  Ask for help with household chores, cooking, grocery shopping, or running errands as needed. Do not try to do everything.  Talk to people close to you about how you are feeling. Get support from your partner, family members, friends, or other new moms.  Try to stay positive in  how you think. Think about the things you are grateful for.  Do not spend a lot of time alone.  Only take medicine as directed by your caregiver.  Keep all your postpartum appointments.  Let your caregiver know if you have any concerns. SEEK MEDICAL CARE IF: You are having a reaction or problems with your medicine. SEEK IMMEDIATE MEDICAL CARE IF:  You have suicidal feelings.  You feel you may harm the baby or someone else. Document Released: 10/04/2003 Document Revised: 03/24/2011 Document Reviewed: 11/05/2010 [facility] Patient Information 2014 [place], [place].   Breastfeeding Deciding to breastfeed is one of the best choices you can make for you and your baby. A change in hormones during pregnancy causes your breast tissue to grow and increases the number and size of your milk ducts. These hormones also allow proteins, sugars, and fats from your blood supply to make breast milk in your milk-producing glands. Hormones prevent breast milk from being released before your baby is born as well as prompt milk flow after birth. Once breastfeeding has begun, thoughts of your baby, as well as his or her sucking or crying, can stimulate the release of milk from your milk-producing glands.  BENEFITS OF BREASTFEEDING For Your Baby  Your first milk (colostrum) helps your baby's digestive system function better.   There are antibodies in your milk that help your baby fight off infections.   Your baby has a lower incidence of asthma, allergies, and sudden infant death syndrome.   The nutrients in breast milk are better for your baby than infant formulas and are designed uniquely for your baby's needs.  Breast milk improves your baby's brain development.   Your baby is less likely to develop other conditions, such as childhood obesity, asthma, or type 2 diabetes mellitus.  For You   Breastfeeding helps to create a very special bond between you and your baby.   Breastfeeding is  convenient. Breast milk is always available at the correct temperature and costs nothing.   Breastfeeding helps to burn calories and helps you lose the weight gained during pregnancy.   Breastfeeding makes your uterus contract to its prepregnancy size faster and slows bleeding (lochia) after you give birth.   Breastfeeding helps to lower your risk of developing type 2 diabetes mellitus, osteoporosis, and breast or ovarian cancer later in life. SIGNS THAT YOUR BABY IS HUNGRY Early Signs of Hunger  Increased alertness or activity.  Stretching.  Movement of the head from side to side.  Movement of the head and opening of the mouth when the corner of the mouth or cheek is stroked (rooting).  Increased sucking sounds, smacking lips, cooing, sighing, or squeaking.  Hand-to-mouth movements.  Increased sucking of fingers or hands. Late Signs of Hunger  Fussing.  Intermittent crying. Extreme Signs of Hunger Signs of extreme hunger will require calming and consoling before your baby will be able to breastfeed successfully. Do not wait for the following signs of extreme hunger to occur before you initiate breastfeeding:   Restlessness.  A loud, strong cry.   Screaming.  BREASTFEEDING BASICS Breastfeeding Initiation  Find a comfortable place to sit or lie down, with your neck and back well supported.  Place a pillow or rolled up blanket under your baby to bring him or her to the level of your breast (if you are seated). Nursing pillows are specially designed to help support your arms and your baby while you breastfeed.  Make sure that your baby's abdomen is facing your abdomen.   Gently massage your breast. With your fingertips, massage from your chest wall toward your nipple in a circular motion. This encourages milk flow. You may need to continue this action during the feeding if your milk flows slowly.  Support your breast with 4 fingers underneath and your thumb above  your nipple. Make sure your fingers are well away from your nipple and your baby's mouth.   Stroke your baby's lips gently with your finger or nipple.   When your baby's mouth is open wide enough, quickly bring your baby to your breast, placing your entire nipple and as much of the colored area around your nipple (areola) as possible into your baby's mouth.   More areola should be visible above your baby's upper lip than below the lower lip.   Your baby's tongue should be between his or her lower gum and your breast.   Ensure that your baby's mouth is correctly positioned around your nipple (latched). Your baby's lips should create a seal on your breast and be turned out (everted).  It is common for your baby to suck about 2-3 minutes in order to start the flow of breast milk. Latching Teaching your baby how to latch on to your breast properly is very important. An improper latch can cause nipple pain and decreased milk supply for you and poor weight gain in your baby. Also, if your baby is not latched onto your nipple properly, he or she may swallow some air during feeding. This can make your baby fussy. Burping your baby when you switch breasts during the feeding can help to get  rid of the air. However, teaching your baby to latch on properly is still the best way to prevent fussiness from swallowing air while breastfeeding. Signs that your baby has successfully latched on to your nipple:    Silent tugging or silent sucking, without causing you pain.   Swallowing heard between every 3-4 sucks.    Muscle movement above and in front of his or her ears while sucking.  Signs that your baby has not successfully latched on to nipple:   Sucking sounds or smacking sounds from your baby while breastfeeding.  Nipple pain. If you think your baby has not latched on correctly, slip your finger into the corner of your baby's mouth to break the suction and place it between your baby's gums.  Attempt breastfeeding initiation again. Signs of Successful Breastfeeding Signs from your baby:   A gradual decrease in the number of sucks or complete cessation of sucking.   Falling asleep.   Relaxation of his or her body.   Retention of a small amount of milk in his or her mouth.   Letting go of your breast by himself or herself. Signs from you:  Breasts that have increased in firmness, weight, and size 1-3 hours after feeding.   Breasts that are softer immediately after breastfeeding.  Increased milk volume, as well as a change in milk consistency and color by the fifth day of breastfeeding.   Nipples that are not sore, cracked, or bleeding. Signs That Your Randel Books is Getting Enough Milk  Wetting at least 3 diapers in a 24-hour period. The urine should be clear and pale yellow by age 65 days.  At least 3 stools in a 24-hour period by age 65 days. The stool should be soft and yellow.  At least 3 stools in a 24-hour period by age 29 days. The stool should be seedy and yellow.  No loss of weight greater than 10% of birth weight during the first 32 days of age.  Average weight gain of 4-7 ounces (113-198 g) per week after age 62 days.  Consistent daily weight gain by age 65 days, without weight loss after the age of 2 weeks. After a feeding, your baby may spit up a small amount. This is common. BREASTFEEDING FREQUENCY AND DURATION Frequent feeding will help you make more milk and can prevent sore nipples and breast engorgement. Breastfeed when you feel the need to reduce the fullness of your breasts or when your baby shows signs of hunger. This is called "breastfeeding on demand." Avoid introducing a pacifier to your baby while you are working to establish breastfeeding (the first 4-6 weeks after your baby is born). After this time you may choose to use a pacifier. Research has shown that pacifier use during the first year of a baby's life decreases the risk of sudden infant death  syndrome (SIDS). Allow your baby to feed on each breast as long as he or she wants. Breastfeed until your baby is finished feeding. When your baby unlatches or falls asleep while feeding from the first breast, offer the second breast. Because newborns are often sleepy in the first few weeks of life, you may need to awaken your baby to get him or her to feed. Breastfeeding times will vary from baby to baby. However, the following rules can serve as a guide to help you ensure that your baby is properly fed:  Newborns (babies 31 weeks of age or younger) may breastfeed every 1-3 hours.  Newborns should not  go longer than 3 hours during the day or 5 hours during the night without breastfeeding.  You should breastfeed your baby a minimum of 8 times in a 24-hour period until you begin to introduce solid foods to your baby at around 61 months of age. BREAST MILK PUMPING Pumping and storing breast milk allows you to ensure that your baby is exclusively fed your breast milk, even at times when you are unable to breastfeed. This is especially important if you are going back to work while you are still breastfeeding or when you are not able to be present during feedings. Your lactation consultant can give you guidelines on how long it is safe to store breast milk.  A breast pump is a machine that allows you to pump milk from your breast into a sterile bottle. The pumped breast milk can then be stored in a refrigerator or freezer. Some breast pumps are operated by hand, while others use electricity. Ask your lactation consultant which type will work best for you. Breast pumps can be purchased, but some hospitals and breastfeeding support groups lease breast pumps on a monthly basis. A lactation consultant can teach you how to hand express breast milk, if you prefer not to use a pump.  CARING FOR YOUR BREASTS WHILE YOU BREASTFEED Nipples can become dry, cracked, and sore while breastfeeding. The following  recommendations can help keep your breasts moisturized and healthy:  Avoid using soap on your nipples.   Wear a supportive bra. Although not required, special nursing bras and tank tops are designed to allow access to your breasts for breastfeeding without taking off your entire bra or top. Avoid wearing underwire-style bras or extremely tight bras.  Air dry your nipples for 3-65mnutes after each feeding.   Use only cotton bra pads to absorb leaked breast milk. Leaking of breast milk between feedings is normal.   Use lanolin on your nipples after breastfeeding. Lanolin helps to maintain your skin's normal moisture barrier. If you use pure lanolin, you do not need to wash it off before feeding your baby again. Pure lanolin is not toxic to your baby. You may also hand express a few drops of breast milk and gently massage that milk into your nipples and allow the milk to air dry. In the first few weeks after giving birth, some women experience extremely full breasts (engorgement). Engorgement can make your breasts feel heavy, warm, and tender to the touch. Engorgement peaks within 3-5 days after you give birth. The following recommendations can help ease engorgement:  Completely empty your breasts while breastfeeding or pumping. You may want to start by applying warm, moist heat (in the shower or with warm water-soaked hand towels) just before feeding or pumping. This increases circulation and helps the milk flow. If your baby does not completely empty your breasts while breastfeeding, pump any extra milk after he or she is finished.  Wear a snug bra (nursing or regular) or tank top for 1-2 days to signal your body to slightly decrease milk production.  Apply ice packs to your breasts, unless this is too uncomfortable for you.  Make sure that your baby is latched on and positioned properly while breastfeeding. If engorgement persists after 48 hours of following these recommendations, contact  your health care provider or a lScience writer OVERALL HEALTH CARE RECOMMENDATIONS WHILE BREASTFEEDING  Eat healthy foods. Alternate between meals and snacks, eating 3 of each per day. Because what you eat affects your breast milk, some of  the foods may make your baby more irritable than usual. Avoid eating these foods if you are sure that they are negatively affecting your baby.  Drink milk, fruit juice, and water to satisfy your thirst (about 10 glasses a day).   Rest often, relax, and continue to take your prenatal vitamins to prevent fatigue, stress, and anemia.  Continue breast self-awareness checks.  Avoid chewing and smoking tobacco.  Avoid alcohol and drug use. Some medicines that may be harmful to your baby can pass through breast milk. It is important to ask your health care provider before taking any medicine, including all over-the-counter and prescription medicine as well as vitamin and herbal supplements. It is possible to become pregnant while breastfeeding. If birth control is desired, ask your health care provider about options that will be safe for your baby. SEEK MEDICAL CARE IF:   You feel like you want to stop breastfeeding or have become frustrated with breastfeeding.  You have painful breasts or nipples.  Your nipples are cracked or bleeding.  Your breasts are red, tender, or warm.  You have a swollen area on either breast.  You have a fever or chills.  You have nausea or vomiting.  You have drainage other than breast milk from your nipples.  Your breasts do not become full before feedings by the fifth day after you give birth.  You feel sad and depressed.  Your baby is too sleepy to eat well.  Your baby is having trouble sleeping.   Your baby is wetting less than 3 diapers in a 24-hour period.  Your baby has less than 3 stools in a 24-hour period.  Your baby's skin or the white part of his or her eyes becomes yellow.   Your baby is not  gaining weight by 52 days of age. SEEK IMMEDIATE MEDICAL CARE IF:   Your baby is overly tired (lethargic) and does not want to wake up and feed.  Your baby develops an unexplained fever. Document Released: 12/30/2004 Document Revised: 01/04/2013 Document Reviewed: 06/23/2012 [facility] Patient Information 2015 [place], [place]. This information is not intended to replace advice given to you by your health care provider. Make sure you discuss any questions you have with your health care provider.

## 2015-01-08 ENCOUNTER — Encounter (HOSPITAL_COMMUNITY): Payer: Self-pay | Admitting: Obstetrics and Gynecology

## 2015-01-09 ENCOUNTER — Encounter (HOSPITAL_COMMUNITY): Payer: Self-pay | Admitting: *Deleted

## 2017-02-01 ENCOUNTER — Encounter: Payer: Self-pay | Admitting: Emergency Medicine

## 2017-02-01 ENCOUNTER — Ambulatory Visit (HOSPITAL_COMMUNITY)
Admission: EM | Admit: 2017-02-01 | Discharge: 2017-02-01 | Disposition: A | Discharge status: Home / Self Care | Payer: Medicaid Other

## 2017-02-01 DIAGNOSIS — B9789 Other viral agents as the cause of diseases classified elsewhere: Secondary | ICD-10-CM | POA: Diagnosis not present

## 2017-02-01 DIAGNOSIS — J069 Acute upper respiratory infection, unspecified: Secondary | ICD-10-CM

## 2017-02-01 DIAGNOSIS — J029 Acute pharyngitis, unspecified: Secondary | ICD-10-CM

## 2017-02-01 MED ORDER — BENZONATATE 100 MG PO CAPS: 100.0000 mg | ORAL_CAPSULE | Freq: Three times a day (TID) | ORAL | 0 refills | Status: DC | PRN

## 2017-02-01 MED ORDER — PSEUDOEPHEDRINE HCL ER 120 MG PO TB12: 120.0000 mg | ORAL_TABLET | Freq: Two times a day (BID) | ORAL | 3 refills | Status: DC

## 2017-02-01 MED ORDER — HYDROCODONE-HOMATROPINE 5-1.5 MG/5ML PO SYRP: 5.0000 mL | ORAL_SOLUTION | Freq: Every evening | ORAL | 0 refills | Status: DC | PRN

## 2017-02-01 MED ORDER — AMOXICILLIN 500 MG PO CAPS: 500.0000 mg | ORAL_CAPSULE | Freq: Three times a day (TID) | ORAL | 0 refills | Status: DC

## 2017-02-01 NOTE — ED Triage Notes (Signed)
Per pt she's having sore throat, cough, sneezing, chills, sinus pressure,

## 2017-02-01 NOTE — ED Provider Notes (Signed)
  MRN: [id number] DOB: [birth year]  Subjective:   Rhonda Barron is a 32 y.o. female presenting for 1 week history of sore throat, mildly productive cough, sneezing, sinus congestion, sinus pressure/pain, chills, left ear pain. Has tried Sudafed, Mucinex. Denies fever, chest pain, shob, wheezing, n/v, abdominal pain, rashes. Denies smoking cigarettes. Denies history of allergies, asthma.    Rhonda Barron is not currently taking any medications and is allergic to latex.  Rhonda Barron  has a past medical history of Abnormal Pap smear and History of chlamydia. Also  has a past surgical history that includes No past surgeries; Inguinal hernia pediatric with laparoscopic exam; and Cesarean section (N/A, 01/05/2015). Her family history includes Asthma in her brother; Diabetes in her father; Hypertension in her father; Stroke in her paternal uncle.   Objective:   Vitals: BP 112/76 (BP Location: Left Arm)   Pulse (!) 58 Comment: Reported HR to nurse Kenton Kingfisher  Temp 98.7 F (37.1 C) (Oral)   Resp 16   LMP 01/14/2017 (Exact Date)   SpO2 100%   Physical Exam  Constitutional: She is oriented to person, place, and time. She appears well-developed and well-nourished.  HENT:  TM's intact bilaterally, no effusions or erythema. Nasal turbinates pink, moist, nasal passages patent. Mild bilateral maxillary sinus tenderness. Oropharynx with slight post-nasal drainage, mucous membranes moist.    Eyes: Right eye exhibits no discharge. Left eye exhibits no discharge. No scleral icterus.  Neck: Normal range of motion. Neck supple.  Cardiovascular: Normal rate, regular rhythm and intact distal pulses. Exam reveals no gallop and no friction rub.  No murmur heard. Pulmonary/Chest: No respiratory distress. She has no wheezes. She has no rales.  Lymphadenopathy:    She has cervical adenopathy (bilateral).  Neurological: She is alert and oriented to person, place, and time.  Skin: Skin is warm and dry.  Psychiatric: She has  a normal mood and affect.   Assessment and Plan :   Viral URI with cough  Sore throat  Will manage supportively for the next 3-4 days. Offered script for amoxicillin in case she has no improvement with supportive care. Return-to-clinic precautions discussed, patient verbalized understanding.     Jaynee Eagles, PA-C 02/01/17 1257

## 2017-02-01 NOTE — Discharge Instructions (Addendum)
For sore throat try using a honey-based tea. Use 3 teaspoons of honey with juice squeezed from half lemon. Place shaved pieces of ginger into 1/2-1 cup of water and warm over stove top. Then mix the ingredients and repeat every 4 hours as needed. Try supportive care for the next 3-4 days including cough medications, pseudoephedrine and if there is no improvement, start amoxicillin to address bacterial sinusitis.

## 2017-03-28 ENCOUNTER — Emergency Department (HOSPITAL_BASED_OUTPATIENT_CLINIC_OR_DEPARTMENT_OTHER)
Admission: EM | Admit: 2017-03-28 | Discharge: 2017-03-28 | Disposition: A | Discharge status: Home / Self Care | Payer: Medicaid Other | Attending: Emergency Medicine | Admitting: Emergency Medicine

## 2017-03-28 ENCOUNTER — Other Ambulatory Visit: Payer: Self-pay

## 2017-03-28 ENCOUNTER — Encounter (HOSPITAL_BASED_OUTPATIENT_CLINIC_OR_DEPARTMENT_OTHER): Payer: Self-pay | Admitting: Emergency Medicine

## 2017-03-28 DIAGNOSIS — Z91040 Latex allergy status: Secondary | ICD-10-CM | POA: Insufficient documentation

## 2017-03-28 DIAGNOSIS — J111 Influenza due to unidentified influenza virus with other respiratory manifestations: Secondary | ICD-10-CM | POA: Insufficient documentation

## 2017-03-28 DIAGNOSIS — J3489 Other specified disorders of nose and nasal sinuses: Secondary | ICD-10-CM | POA: Insufficient documentation

## 2017-03-28 DIAGNOSIS — R51 Headache: Secondary | ICD-10-CM | POA: Diagnosis present

## 2017-03-28 DIAGNOSIS — R6889 Other general symptoms and signs: Secondary | ICD-10-CM

## 2017-03-28 MED ORDER — ONDANSETRON 4 MG PO TBDP: 4.0000 mg | ORAL_TABLET | Freq: Three times a day (TID) | ORAL | 0 refills | Status: DC | PRN

## 2017-03-28 MED ORDER — KETOROLAC TROMETHAMINE 30 MG/ML IJ SOLN
60.0000 mg | Freq: Once | INTRAMUSCULAR | Status: AC
  Administered 2017-03-28: 60 mg via INTRAMUSCULAR
  Filled 2017-03-28: qty 2

## 2017-03-28 MED ORDER — DEXAMETHASONE SODIUM PHOSPHATE 10 MG/ML IJ SOLN
10.0000 mg | Freq: Once | INTRAMUSCULAR | Status: AC
  Administered 2017-03-28: 10 mg via INTRAMUSCULAR
  Filled 2017-03-28: qty 1

## 2017-03-28 MED ORDER — OSELTAMIVIR PHOSPHATE 75 MG PO CAPS: 75.0000 mg | ORAL_CAPSULE | Freq: Two times a day (BID) | ORAL | 0 refills | Status: DC

## 2017-03-28 NOTE — ED Triage Notes (Signed)
Pt c/o chills, diarrhea, sinus pressure, congestion since wed.

## 2017-03-28 NOTE — Discharge Instructions (Signed)
You may alternate Tylenol 1000 mg every 6 hours as needed for fever and pain and ibuprofen 800 mg every 8 hours as needed for fever and pain. Please rest and drink plenty of fluids. This is a viral illness causing your symptoms. You do not need antibiotics for a virus. You may use over-the-counter nasal saline spray and Afrin nasal saline spray as needed for nasal congestion. Please do not use Afrin for more than 3 days in a row. You may use Mucinex and Dextromethorphan as needed for cough.  You may use lozenges and Chloraseptic spray to help with sore throat.  Warm salt water gargles can also help with sore throat.  You may use over-the-counter Unisom (doxyalamine) or Benadryl to help with sleep.  Please note that some combination medicines such as DayQuil and NyQuil have multiple medications in them.  Please make sure you look at all labels to ensure that you are not taking too much of any one particular medication.  Symptoms from a virus may take 7-14 days to run its course.  We do not test for the flu from the emergency department as we do not have rapid flu swabs and it takes hours for this test to come back and it would not change our management. The flu is treated like any other virus with supportive measures as listed above. Tamiflu has to be taken within the first 48 hours of symptoms.  Tamiflu has many side effects including nausea, vomiting and diarrhea.

## 2017-03-28 NOTE — ED Provider Notes (Signed)
TIME SEEN: 2:40 AM  CHIEF COMPLAINT: Flulike symptoms  HPI: Patient is a 32 year old female with no significant past medical history who presents to the emergency department with complaints of left-sided facial pressure and headache, chills, subjective fevers, runny nose, cough, sore throat, intermittent abdominal pain, nausea, diarrhea.  States symptoms have been ongoing for 1-2 days.  States she thinks she could have the flu.  Did not have a flu shot this year.  No sick contacts.  No recent travel.  ROS: See HPI Constitutional:  fever  Eyes: no drainage  ENT:  runny nose   Cardiovascular:  no chest pain  Resp: no SOB  GI: no vomiting GU: no dysuria Integumentary: no rash  Allergy: no hives  Musculoskeletal: no leg swelling  Neurological: no slurred speech ROS otherwise negative  PAST MEDICAL HISTORY/PAST SURGICAL HISTORY:  Past Medical History:  Diagnosis Date  . Abnormal Pap smear   . History of chlamydia     MEDICATIONS:  Prior to Admission medications   Not on File    ALLERGIES:  Allergies  Allergen Reactions  . Latex Itching    SOCIAL HISTORY:  Social History   Tobacco Use  . Smoking status: Never Smoker  . Smokeless tobacco: Never Used  Substance Use Topics  . Alcohol use: Yes    Comment: occ    FAMILY HISTORY: Family History  Problem Relation Age of Onset  . Hypertension Father   . Diabetes Father   . Asthma Brother   . Stroke Paternal Uncle   . Anesthesia problems Neg Hx   . Hypotension Neg Hx   . Pseudochol deficiency Neg Hx   . Malignant hyperthermia Neg Hx     EXAM: BP 129/85 (BP Location: Left Arm)   Pulse 64   Temp 98.3 F (36.8 C) (Oral)   Resp 16   Ht 5\' 7"  (1.702 m)   Wt 107 kg (236 lb)   SpO2 100%   BMI 36.96 kg/m  CONSTITUTIONAL: Alert and oriented and responds appropriately to questions. Well-appearing; well-nourished HEAD: Normocephalic EYES: Conjunctivae clear, pupils appear equal, EOMI ENT: normal nose; moist mucous  membranes; No pharyngeal erythema or petechiae, no tonsillar hypertrophy or exudate, no uvular deviation, no unilateral swelling, no trismus or drooling, no muffled voice, normal phonation, no stridor, no dental caries present, no drainable dental abscess noted, no Ludwig's angina, tongue sits flat in the bottom of the mouth, no angioedema, no facial erythema or warmth, no facial swelling; no pain with movement of the neck.  No significant facial tenderness on exam.  TMs are clear bilaterally without erythema, purulence, bulging, perforation, effusion.  No cerumen impaction or sign of foreign body in the external auditory canal. No inflammation, erythema or drainage from the external auditory canal. No signs of mastoiditis. No pain with manipulation of the pinna bilaterally. NECK: Supple, no meningismus, no nuchal rigidity, no LAD  CARD: RRR; S1 and S2 appreciated; no murmurs, no clicks, no rubs, no gallops RESP: Normal chest excursion without splinting or tachypnea; breath sounds clear and equal bilaterally; no wheezes, no rhonchi, no rales, no hypoxia or respiratory distress, speaking full sentences ABD/GI: Normal bowel sounds; non-distended; soft, non-tender, no rebound, no guarding, no peritoneal signs, no hepatosplenomegaly BACK:  The back appears normal and is non-tender to palpation, there is no CVA tenderness EXT: Normal ROM in all joints; non-tender to palpation; no edema; normal capillary refill; no cyanosis, no calf tenderness or swelling    SKIN: Normal color for age and race;  warm; no rash NEURO: Moves all extremities equally PSYCH: The patient's mood and manner are appropriate. Grooming and personal hygiene are appropriate.  MEDICAL DECISION MAKING: Patient here with flulike symptoms.  She is extremely well-appearing at this time.  Nothing to suggest meningitis, sepsis, bacteremia, pneumonia.  Suspect facial pain and headache is due to sinus pressure.  Will give Decadron and Toradol for  symptomatic relief.  I do not feel she needs antibiotics currently.  We have discussed risk and benefits of Tamiflu.  Patient would like a prescription for this medication.  She will start the medication in the morning.  Discussed supportive care instructions at home.  Will provide with work note.  Patient comfortable with this plan.  At this time, I do not feel there is any life-threatening condition present. I have reviewed and discussed all results (EKG, imaging, lab, urine as appropriate) and exam findings with patient/family. I have reviewed nursing notes and appropriate previous records.  I feel the patient is safe to be discharged home without further emergent workup and can continue workup as an outpatient as needed. Discussed usual and customary return precautions. Patient/family verbalize understanding and are comfortable with this plan.  Outpatient follow-up has been provided if needed. All questions have been answered.      Randeep Biondolillo, Delice Bison, DO 03/28/17 [phone]

## 2017-03-28 NOTE — ED Notes (Signed)
Alert, NAD, calm, interactive, resps e/u, speaking in clear complete sentences, no dyspnea noted, skin W&D, initial VSS, c/o L sided facial pain, HA, cough, congestion, L ear popping, chills, diarrhea x1, nausea, and intermittent dizziness, (denies: sore throat, sob, vomiting). Family at [facility]

## 2017-03-28 NOTE — ED Notes (Signed)
EDP into room, prior to RN assessment, see MD notes, pending orders.   

## 2017-12-30 ENCOUNTER — Encounter (HOSPITAL_BASED_OUTPATIENT_CLINIC_OR_DEPARTMENT_OTHER): Payer: Self-pay | Admitting: *Deleted

## 2017-12-30 ENCOUNTER — Emergency Department (HOSPITAL_BASED_OUTPATIENT_CLINIC_OR_DEPARTMENT_OTHER)
Admission: EM | Admit: 2017-12-30 | Discharge: 2017-12-30 | Disposition: A | Discharge status: Home / Self Care | Payer: Medicaid Other | Attending: Emergency Medicine | Admitting: Emergency Medicine

## 2017-12-30 DIAGNOSIS — M545 Low back pain, unspecified: Secondary | ICD-10-CM

## 2017-12-30 DIAGNOSIS — M62830 Muscle spasm of back: Secondary | ICD-10-CM | POA: Insufficient documentation

## 2017-12-30 DIAGNOSIS — Z91040 Latex allergy status: Secondary | ICD-10-CM | POA: Insufficient documentation

## 2017-12-30 MED ORDER — CYCLOBENZAPRINE HCL 10 MG PO TABS: 10.0000 mg | ORAL_TABLET | Freq: Two times a day (BID) | ORAL | 0 refills | Status: DC | PRN

## 2017-12-30 NOTE — ED Provider Notes (Signed)
[facility] DEPART[facility] Provider Note   CSN: [id number] Arrival date & time: 12/30/17  0046     History   Chief Complaint Chief Complaint  Patient presents with  . Back Pain    HPI Rhonda Barron is a 32 y.o. female.  The history is provided by the patient and medical records. No language interpreter was used.  Back Pain   This is a new problem. The current episode started more than 2 days ago. The problem occurs constantly. The problem has not changed since onset.The pain is associated with lifting heavy objects. The pain is present in the lumbar spine. The quality of the pain is described as aching. The pain does not radiate. The pain is moderate. The symptoms are aggravated by twisting, bending and certain positions. The pain is the same all the time. Pertinent negatives include no chest pain, no fever, no numbness, no headaches, no abdominal pain, no abdominal swelling, no bowel incontinence, no perianal numbness, no bladder incontinence, no dysuria, no pelvic pain, no leg pain, no paresthesias, no paresis, no tingling and no weakness. She has tried nothing for the symptoms. The treatment provided no relief.    Past Medical History:  Diagnosis Date  . Abnormal Pap smear   . History of chlamydia     Patient Active Problem List   Diagnosis Date Noted  . 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 Abdominal pain during pregnancy 05/15/2014  . 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 Inguinal hernia - pediatric w/ laparscopic exam 05/15/2014  . Obese 12/23/2010  . History of abnormal Pap smear 12/23/2010    Past Surgical History:  Procedure Laterality Date  . CESAREAN SECTION N/A 01/05/2015   Procedure: CESAREAN SECTION;  Surgeon: Ena Dawley, MD;  Location: [facility];  Service: Obstetrics;  Laterality: N/A;  . INGUINAL HERNIA PEDIATRIC WITH LAPAROSCOPIC EXAM    . NO PAST SURGERIES       OB History    Gravida  2   Para  2   Term  2   Preterm    0   AB  0   Living  2     SAB  0   TAB  0   Ectopic  0   Multiple  0   Live Births  2            Home Medications    Prior to Admission medications   Medication Sig Start Date End Date Taking? Authorizing Provider  ondansetron (ZOFRAN ODT) 4 MG disintegrating tablet Take 1 tablet (4 mg total) by mouth every 8 (eight) hours as needed for nausea or vomiting. 03/28/17   Ward, Delice Bison, DO  oseltamivir (TAMIFLU) 75 MG capsule Take 1 capsule (75 mg total) by mouth every 12 (twelve) hours. 03/28/17   Ward, Delice Bison, DO    Family History Family History  Problem Relation Age of Onset  . Hypertension Father   . Diabetes Father   . Asthma Brother   . Stroke Paternal Uncle   . Anesthesia problems Neg Hx   . Hypotension Neg Hx   . Pseudochol deficiency Neg Hx   . Malignant hyperthermia Neg Hx     Social History Social History   Tobacco Use  . Smoking status: Never Smoker  . Smokeless tobacco: Never Used  Substance Use Topics  . Alcohol use: Yes    Comment: occ  . Drug use: No     Allergies   Latex   Review  of Systems Review of Systems  Constitutional: Negative for chills, diaphoresis, fatigue and fever.  HENT: Negative for congestion.   Respiratory: Negative for cough, chest tightness, shortness of breath and wheezing.   Cardiovascular: Negative for chest pain and palpitations.  Gastrointestinal: Negative for abdominal pain, blood in stool, bowel incontinence, constipation, diarrhea, nausea and vomiting.  Genitourinary: Negative for bladder incontinence, dysuria, flank pain, frequency and pelvic pain.  Musculoskeletal: Positive for back pain. Negative for neck pain and neck stiffness.  Skin: Negative for wound.  Neurological: Negative for tingling, weakness, light-headedness, numbness, headaches and paresthesias.  Psychiatric/Behavioral: Negative for agitation and behavioral problems.  All other systems reviewed and are negative.    Physical  Exam Updated Vital Signs BP 124/78 (BP Location: Right Arm)   Pulse 70   Temp 98 F (36.7 C) (Oral)   Resp 18   Ht 5\' 8"  (1.727 m)   Wt 108 kg   LMP 12/11/2017   SpO2 100%   BMI 36.19 kg/m   Physical Exam Vitals signs and nursing note reviewed.  Constitutional:      General: She is not in acute distress.    Appearance: She is well-developed. She is not diaphoretic.  HENT:     Head: Normocephalic and atraumatic.     Right Ear: External ear normal.     Left Ear: External ear normal.     Nose: Nose normal.     Mouth/Throat:     Pharynx: No oropharyngeal exudate.  Eyes:     Conjunctiva/sclera: Conjunctivae normal.     Pupils: Pupils are equal, round, and reactive to light.  Neck:     Musculoskeletal: Normal range of motion and neck supple.  Pulmonary:     Effort: No respiratory distress.     Breath sounds: No stridor.  Abdominal:     General: There is no distension.     Tenderness: There is no abdominal tenderness. There is no rebound.  Musculoskeletal:        General: Tenderness present. No signs of injury.     Lumbar back: She exhibits tenderness, pain and spasm. She exhibits normal range of motion, no bony tenderness and no laceration.       Back:  Skin:    General: Skin is warm.     Findings: No erythema or rash.  Neurological:     General: No focal deficit present.     Mental Status: She is alert and oriented to person, place, and time.     GCS: GCS eye subscore is 4. GCS verbal subscore is 5. GCS motor subscore is 6.     Sensory: Sensation is intact. No sensory deficit.     Motor: No weakness, tremor or abnormal muscle tone.     Coordination: Coordination normal.     Gait: Gait is intact.     Deep Tendon Reflexes: Reflexes are normal and symmetric.     Comments: Normal sensation, strength, and gait and legs.  Normal pulses.  Well appearance.      ED Treatments / Results  Labs (all labs ordered are listed, but only abnormal results are displayed) Labs  Reviewed - No data to display  EKG None  Radiology No results found.  Procedures Procedures (including critical care time)  Medications Ordered in ED Medications - No data to display   Initial Impression / Assessment and Plan / ED Course  I have reviewed the triage vital signs and the nursing notes.  Pertinent labs & imaging results that were  available during my care of the patient were reviewed by me and considered in my medical decision making (see chart for details).     Rhonda Barron is a 32 y.o. female with no significant past medical history who presents with low back pain.  Patient reports that over the last week she has been working more shifts and has been lifting heavy boxes.  She reports no acute onset of pain but reports gradual pain over the last several days in her low back.  She reports the pain is primarily in the paraspinal areas.  No significant midline pain.  She denies any numbness, tingling, or weakness of legs.  No groin numbness.  No incontinence of bowel or bladder.  No pain in the abdomen or hips.  No fevers, chills, chest pain, shortness of breath, or other symptoms.  She reports her pain is more severe when she is using it at work.  She reports that she has tried stretching which helped somewhat but the pain just continues to worsen.  She has not tried medicines yet.  On exam, patient has paraspinal tenderness in her low back.  No midline tenderness.  No CVA tenderness.  Lungs were clear and chest was nontender.  Abdomen was nontender.  Patient had normal sensation strength and pulses in lower extremities.  Normal gait.  Mid back and neck were nontender.  Shared decision made conversation held with patient as to work-up and management.  Given her lack of trauma and lack of sudden onset, given her history, suspect a muscular type pain and spasm causing her discomfort.  Suspect related to overuse.  Given her lack of midline tenderness and other red flags, we  agreed to hold on x-ray or CT imaging at this time.  We did agree to try more conservative measures with instructions for over-the-counter anti-inflammatory medications, heat pads, stretches, as well as a prescription for muscle relaxant.  Patient agreed with this plan as well as outpatient follow-up.  Patient also understood return precautions for any new or worsening symptoms including the red flags we discussed.  Patient and other questions or concerns and was discharged in good condition.   Final Clinical Impressions(s) / ED Diagnoses   Final diagnoses:  Acute bilateral low back pain without sciatica  Muscle spasm of back    ED Discharge Orders         Ordered    cyclobenzaprine (FLEXERIL) 10 MG tablet  2 times daily PRN     12/30/17 0140          Clinical Impression: 1. Acute bilateral low back pain without sciatica   2. Muscle spasm of back     Disposition: Discharge  Condition: Good  I have discussed the results, Dx and Tx plan with the pt(& family if present). He/she/they expressed understanding and agree(s) with the plan. Discharge instructions discussed at great length. Strict return precautions discussed and pt &/or family have verbalized understanding of the instructions. No further questions at time of discharge.    New Prescriptions   CYCLOBENZAPRINE (FLEXERIL) 10 MG TABLET    Take 1 tablet (10 mg total) by mouth 2 (two) times daily as needed for muscle spasms.    Follow Up: [place] [id number] [phone] Schedule an appointment as soon as possible for a visit    [place] [address] [id number] [place] [phone]  , Gwenyth Allegra, MD 12/30/17 [phone]

## 2017-12-30 NOTE — ED Triage Notes (Signed)
Lower back pain x 3 days. Worse with movement.

## 2017-12-30 NOTE — Discharge Instructions (Signed)
Your history and exam today were overall reassuring.  We suspect a musculoskeletal pain in your low back.  He did not have any of the red flags we discussed such as the numbness, tingling, or weakness of your legs.  We also did not find any report or evidence of incontinence of bowel or bladder.  This is likely related to overuse as we discussed at work.  Please stay hydrated and rest.  Please continue doing the stretches.  Please follow-up with a primary doctor and try using the muscle relaxant we prescribed as well as over-the-counter anti-inflammatory medications.  If any symptoms change or worsen, please return to the nearest emergency department.

## 2017-12-30 NOTE — ED Notes (Signed)
Pt states woke w lower back pain 3 days ago  Pain increased w movement,  States working overtime  Lifting a lot of boxes  Denies ua sx

## 2018-05-22 ENCOUNTER — Other Ambulatory Visit: Payer: Self-pay

## 2018-05-22 ENCOUNTER — Encounter (HOSPITAL_COMMUNITY): Payer: Self-pay | Admitting: Emergency Medicine

## 2018-05-22 ENCOUNTER — Telehealth: Payer: Self-pay | Admitting: *Deleted

## 2018-05-22 ENCOUNTER — Emergency Department (HOSPITAL_COMMUNITY)
Admission: EM | Admit: 2018-05-22 | Discharge: 2018-05-22 | Disposition: A | Discharge status: Home / Self Care | Payer: Medicaid Other | Attending: Emergency Medicine | Admitting: Emergency Medicine

## 2018-05-22 DIAGNOSIS — J039 Acute tonsillitis, unspecified: Secondary | ICD-10-CM | POA: Diagnosis not present

## 2018-05-22 DIAGNOSIS — J029 Acute pharyngitis, unspecified: Secondary | ICD-10-CM | POA: Diagnosis present

## 2018-05-22 DIAGNOSIS — Z79899 Other long term (current) drug therapy: Secondary | ICD-10-CM | POA: Diagnosis not present

## 2018-05-22 DIAGNOSIS — Z91040 Latex allergy status: Secondary | ICD-10-CM | POA: Diagnosis not present

## 2018-05-22 LAB — GROUP A STREP BY PCR: Group A Strep by PCR: NOT DETECTED

## 2018-05-22 MED ORDER — ACETAMINOPHEN 325 MG PO TABS
650.0000 mg | ORAL_TABLET | Freq: Once | ORAL | Status: AC
Start: 2018-05-22 — End: 2018-05-22
  Administered 2018-05-22: 07:00:00 650 mg via ORAL
  Filled 2018-05-22: qty 2

## 2018-05-22 MED ORDER — CLINDAMYCIN HCL 300 MG PO CAPS: 300.0000 mg | ORAL_CAPSULE | Freq: Three times a day (TID) | ORAL | 0 refills | Status: AC

## 2018-05-22 MED ORDER — DEXAMETHASONE SODIUM PHOSPHATE 10 MG/ML IJ SOLN
10.0000 mg | Freq: Once | INTRAMUSCULAR | Status: AC
  Administered 2018-05-22: 10 mg via INTRAMUSCULAR
  Filled 2018-05-22: qty 1

## 2018-05-22 NOTE — Telephone Encounter (Signed)
Received call from patient, stating she did not receive a Rx for her abx. Pt was to receive a printed Rx. Notified attending and CM will contact [facility] for callrd in RX to pharmacy. Jonnie Finner RN CCM Case Mgmt phone [phone]

## 2018-05-22 NOTE — ED Triage Notes (Signed)
Pt c/o sore throat x 24 hours, chills, body aches, HA. Denies v/d. L ear pain, nasal drainage.

## 2018-05-22 NOTE — ED Provider Notes (Signed)
[facility] EMERGENCY DEPARTMENT Provider Note   CSN: [id number] Arrival date & time: 05/22/18  0429    History   Chief Complaint Chief Complaint  Patient presents with  . Sore Throat    HPI Rhonda Barron is a 33 y.o. female.     HPI   Pt is a 33 y/o female who presents to the ED c/o sore throat that began yesterday. Also c/o fevers, chills, body aches, and headache. Also c/o left ear pain/pressure that has resolved and nasal drainage. Denies NVD. No cough, States she took ibuprofen around 8pm and it did not resolve sxs. sxs have been constant since onset. Pain rated 7.5/10.   Past Medical History:  Diagnosis Date  . Abnormal Pap smear   . History of chlamydia     Patient Active Problem List   Diagnosis Date Noted  . 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 Abdominal pain during pregnancy 05/15/2014  . 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 Inguinal hernia - pediatric w/ laparscopic exam 05/15/2014  . Obese 12/23/2010  . History of abnormal Pap smear 12/23/2010    Past Surgical History:  Procedure Laterality Date  . CESAREAN SECTION N/A 01/05/2015   Procedure: CESAREAN SECTION;  Surgeon: Ena Dawley, MD;  Location: [facility];  Service: Obstetrics;  Laterality: N/A;  . INGUINAL HERNIA PEDIATRIC WITH LAPAROSCOPIC EXAM    . NO PAST SURGERIES       OB History    Gravida  2   Para  2   Term  2   Preterm  0   AB  0   Living  2     SAB  0   TAB  0   Ectopic  0   Multiple  0   Live Births  2            Home Medications    Prior to Admission medications   Medication Sig Start Date End Date Taking? Authorizing Provider  acetaminophen (TYLENOL) 500 MG tablet Take 1,000 mg by mouth every 6 (six) hours as needed for mild pain.   Yes [provider]  metroNIDAZOLE (FLAGYL) 500 MG tablet Take 500 mg by mouth 2 (two) times daily.    Yes [provider]  ondansetron (ZOFRAN ODT) 4 MG disintegrating tablet Take  1 tablet (4 mg total) by mouth every 8 (eight) hours as needed for nausea or vomiting. 03/28/17  Yes Ward, Delice Bison, DO  clindamycin (CLEOCIN) 300 MG capsule Take 1 capsule (300 mg total) by mouth 3 (three) times daily for 7 days. 05/22/18 05/29/18  Yona Kosek S, PA-C  cyclobenzaprine (FLEXERIL) 10 MG tablet Take 1 tablet (10 mg total) by mouth 2 (two) times daily as needed for muscle spasms. Patient not taking: Reported on 05/22/2018 12/30/17   Tegeler, Gwenyth Allegra, MD  oseltamivir (TAMIFLU) 75 MG capsule Take 1 capsule (75 mg total) by mouth every 12 (twelve) hours. Patient not taking: Reported on 05/22/2018 03/28/17   Ward, Delice Bison, DO    Family History Family History  Problem Relation Age of Onset  . Hypertension Father   . Diabetes Father   . Asthma Brother   . Stroke Paternal Uncle   . Anesthesia problems Neg Hx   . Hypotension Neg Hx   . Pseudochol deficiency Neg Hx   . Malignant hyperthermia Neg Hx     Social History Social History   Tobacco Use  . Smoking status: Never Smoker  . Smokeless tobacco: Never  Used  Substance Use Topics  . Alcohol use: Yes    Comment: occ  . Drug use: No     Allergies   Latex   Review of Systems Review of Systems  Constitutional: Positive for chills, diaphoresis and fever.  HENT: Positive for congestion and sore throat. Negative for ear pain.   Eyes: Negative for visual disturbance.  Respiratory: Negative for cough.   Cardiovascular: Negative for chest pain.  Gastrointestinal: Negative for abdominal pain, constipation, diarrhea, nausea and vomiting.  Genitourinary: Negative for dysuria and hematuria.  Musculoskeletal: Positive for myalgias.  Skin: Negative for rash.  Neurological: Positive for headaches.  All other systems reviewed and are negative.    Physical Exam Updated Vital Signs BP (!) 105/57   Pulse 96   Temp (!) 100.6 F (38.1 C)   Resp 20   Ht 5\' 8"  (1.727 m)   Wt 108.9 kg   LMP 05/05/2018   SpO2 99%   BMI  36.49 kg/m   Physical Exam Vitals signs and nursing note reviewed.  Constitutional:      General: She is not in acute distress.    Appearance: She is well-developed.  HENT:     Head: Normocephalic and atraumatic.     Nose: No congestion or rhinorrhea.     Mouth/Throat:     Mouth: Mucous membranes are moist.     Pharynx: Posterior oropharyngeal erythema present. No oropharyngeal exudate.     Tonsils: 1+ on the right. 1+ on the left.  Eyes:     Conjunctiva/sclera: Conjunctivae normal.  Neck:     Musculoskeletal: Neck supple.  Cardiovascular:     Rate and Rhythm: Normal rate and regular rhythm.     Heart sounds: Normal heart sounds. No murmur.  Pulmonary:     Effort: Pulmonary effort is normal. No respiratory distress.     Breath sounds: Normal breath sounds. No wheezing or rales.  Abdominal:     General: There is no distension.     Palpations: Abdomen is soft.     Tenderness: There is no abdominal tenderness.  Lymphadenopathy:     Cervical: Cervical adenopathy present.  Skin:    General: Skin is warm and dry.  Neurological:     Mental Status: She is alert.      ED Treatments / Results  Labs (all labs ordered are listed, but only abnormal results are displayed) Labs Reviewed  GROUP A STREP BY PCR    EKG None  Radiology No results found.  Procedures Procedures (including critical care time)  Medications Ordered in ED Medications  acetaminophen (TYLENOL) tablet 650 mg (650 mg Oral Given 05/22/18 0640)  dexamethasone (DECADRON) injection 10 mg (10 mg Intramuscular Given 05/22/18 0640)     Initial Impression / Assessment and Plan / ED Course  I have reviewed the triage vital signs and the nursing notes.  Pertinent labs & imaging results that were available during my care of the patient were reviewed by me and considered in my medical decision making (see chart for details).     Final Clinical Impressions(s) / ED Diagnoses   Final diagnoses:  Tonsillitis    Pt febrile with tonsillar edema, cervical lymphadenopathy, & dysphagia; strep test negative however clinically patient has tonsillitis. Treated in the Ed with steroids, tylenol. Will discharge patient with clindamycin. Pt appears mildly dehydrated, discussed importance of water rehydration. Presentation non concerning for PTA or infxn spread to soft tissue. No trismus or uvula deviation. advised that because strep testing is  negative she should likely self quarantine. Specific return precautions discussed. Pt able to drink water in ED without difficulty with intact air way. Recommended PCP follow up. Advised to return if new or worsening sxs develop. She voices understanding of the plan and is in agreement. All questions answered.    ---------------   [name] was evaluated in Emergency Department on 05/22/2018 for the symptoms described in the history of present illness. She was evaluated in the context of the global COVID-19 pandemic, which necessitated consideration that the patient might be at risk for infection with the SARS-CoV-2 virus that causes COVID-19. Institutional protocols and algorithms that pertain to the evaluation of patients at risk for COVID-19 are in a state of rapid change based on information released by regulatory bodies including the CDC and federal and state organizations. These policies and algorithms were followed during the patient's care in the ED.   ED Discharge Orders         Ordered    clindamycin (CLEOCIN) 300 MG capsule  3 times daily     05/22/18 0717           Rodney Booze, PA-C 10/93/23 [id number]    Delora Fuel, MD 22/02/54 1940

## 2018-05-22 NOTE — Discharge Instructions (Addendum)
You were given a prescription for antibiotics. Please take the antibiotic prescription fully.   Make sure to stay well hydrated. Rotate tylenol and motrin for pain and fevers.  You should be isolated for at least 7 days since the onset of your symptoms AND >72 hours after symptoms resolution (absence of fever without the use of fever reducing medication and improvement in respiratory symptoms), whichever is longer  Please follow up with your primary care provider within 5-7 days for re-evaluation of your symptoms. If you do not have a primary care provider, information for a healthcare clinic has been provided for you to make arrangements for follow up care. Please return to the emergency department for any new or worsening symptoms.

## 2018-06-15 ENCOUNTER — Telehealth: Payer: Self-pay

## 2018-06-15 NOTE — Telephone Encounter (Signed)
Called the patient per the referral request. She stated she is unsure what method of birth control she would like to use. She stated she would like something without a lot of hormones. Informed of the virtual visit option and sent a link for mychart.

## 2018-06-16 ENCOUNTER — Encounter: Payer: Self-pay | Admitting: Nurse Practitioner

## 2018-06-16 ENCOUNTER — Telehealth (INDEPENDENT_AMBULATORY_CARE_PROVIDER_SITE_OTHER): Payer: Medicaid Other | Admitting: Nurse Practitioner

## 2018-06-16 ENCOUNTER — Other Ambulatory Visit: Payer: Self-pay

## 2018-06-16 DIAGNOSIS — Z3009 Encounter for other general counseling and advice on contraception: Secondary | ICD-10-CM

## 2018-06-16 NOTE — Patient Instructions (Signed)

## 2018-06-16 NOTE — Progress Notes (Signed)
Pt wants IUD

## 2018-06-16 NOTE — Progress Notes (Signed)
TELEHEALTH VIRTUAL GYNECOLOGY VISIT ENCOUNTER NOTE  I connected with Rhonda Barron on 06/16/18 at  8:35 AM EDT by telephone at home and verified that I am speaking with the correct person using two identifiers.   I discussed the limitations, risks, security and privacy concerns of performing an evaluation and management service by telephone and the availability of in person appointments. I also discussed with the patient that there may be a patient responsible charge related to this service. The patient expressed understanding and agreed to proceed.  Referral from [facility].  Had pap in August 2019.  Needs Nexplanon removal - expired in January and IUD insertion.  Has an appointment at [facility] for [place] removal but also wants IUD.  Has not liked Nexplanon due to pain in her arm from it.   History:  Rhonda Barron is a 33 y.o. [phone] female being evaluated today for contraceptive management. She denies any abnormal vaginal discharge, bleeding, pelvic pain or other concerns.    Menses when not on birth control is 7 days of heavy bleeding and cramping.  Cycles on Nexplanon have been monthly for 4 days.   Past Medical History:  Diagnosis Date  . Abnormal Pap smear   . History of chlamydia    Past Surgical History:  Procedure Laterality Date  . CESAREAN SECTION N/A 01/05/2015   Procedure: CESAREAN SECTION;  Surgeon: Ena Dawley, MD;  Location: [facility];  Service: Obstetrics;  Laterality: N/A;  . INGUINAL HERNIA PEDIATRIC WITH LAPAROSCOPIC EXAM    . NO PAST SURGERIES     The following portions of the patient's history were reviewed and updated as appropriate: allergies, current medications, past family history, past medical history, past social history, past surgical history and problem list.   Health Maintenance:  Normal pap on August 2019 at [facility].  Discussed Pap smear and client reports it was done at [facility] in August 2019 - Don't know if she  actually had Pap smear done or if it was a pelvic exam only.  No lab result found on chart.  Review of Systems:  Pertinent items noted in HPI and remainder of comprehensive ROS otherwise negative.  Physical Exam:   General:  Alert, oriented and cooperative.   Mental Status: Normal mood and affect perceived. Normal judgment and thought content.  Physical exam deferred due to nature of the encounter  Labs and Imaging No results found for this or any previous visit (from the past 336 hour(s)). No results found.    Assessment and Plan:     1. Family planning counseling Will plan to come to this office for Nexplanon removal and for Liletta IUD insertion (best choice for her usual menstrual cycle of 7 days, heavy bleeding with cramping).  Did not realize she could get both done at one visit.  Thought that since we did not put in the Nexplanon, we could not take it out.  Advised it was possible to have both things done.  Reviewed the benefits of IUDs and sent IUD info on AVS to her.       I discussed the assessment and treatment plan with the patient. The patient was provided an opportunity to ask questions and all were answered. The patient agreed with the plan and demonstrated an understanding of the instructions.   The patient was advised to call back if she does not get a call from the office to schedule an appointment for the IUD.  I provided 10 minutes of  non-face-to-face time during this encounter.   Virginia Rochester, NP [facility] for [employer] [facility], [place]

## 2018-06-22 ENCOUNTER — Encounter: Payer: Self-pay | Admitting: *Deleted

## 2018-06-28 ENCOUNTER — Telehealth: Payer: Self-pay | Admitting: Obstetrics and Gynecology

## 2018-06-28 NOTE — Telephone Encounter (Signed)
Attempted to call patient on both numbers listed in Epic. Was not able to reach patient to let her know we needed to reschedule her appointment due to changes we needed to make in the office. Left a detailed message on her VM.

## 2018-06-30 ENCOUNTER — Ambulatory Visit: Payer: Medicaid Other | Admitting: Obstetrics and Gynecology

## 2018-07-06 ENCOUNTER — Encounter: Payer: Self-pay | Admitting: Medical

## 2018-07-06 ENCOUNTER — Other Ambulatory Visit: Payer: Self-pay

## 2018-07-06 ENCOUNTER — Ambulatory Visit (INDEPENDENT_AMBULATORY_CARE_PROVIDER_SITE_OTHER): Payer: Medicaid Other | Admitting: Medical

## 2018-07-06 VITALS — BP 117/78 | HR 72 | Temp 98.3°F | Ht 67.0 in | Wt 251.2 lb

## 2018-07-06 DIAGNOSIS — Z3046 Encounter for surveillance of implantable subdermal contraceptive: Secondary | ICD-10-CM

## 2018-07-06 DIAGNOSIS — Z3009 Encounter for other general counseling and advice on contraception: Secondary | ICD-10-CM

## 2018-07-06 MED ORDER — NORGESTIMATE-ETH ESTRADIOL 0.25-35 MG-MCG PO TABS: 1.0000 | ORAL_TABLET | Freq: Every day | ORAL | 11 refills | Status: DC

## 2018-07-06 NOTE — Patient Instructions (Signed)

## 2018-07-06 NOTE — Progress Notes (Signed)
Pt wants BC Pills instead of IUD

## 2018-07-06 NOTE — Progress Notes (Signed)
GYNECOLOGY CLINIC PROCEDURE NOTE  [name] is a 33 y.o. [phone] here for Nexplanon removal. No GYN concerns.  Last pap smear was on 08/2015 and was normal.  Planning for next pap smear at PCP office in August. No other gynecologic concerns.  Nexplanon Removal Patient was given informed consent for removal of her Nexplanon.  Appropriate time out taken. Nexplanon site identified.  Area prepped in usual sterile fashon. One ml of 1% lidocaine was used to anesthetize the area at the distal end of the implant. A small stab incision was made right beside the implant on the distal portion.  The Nexplanon rod was grasped using hemostats and removed without difficulty.  There was minimal blood loss. There were no complications. A pressure bandage was applied to reduce any bruising.  The patient tolerated the procedure well and was given post procedure instructions.  Patient is planning to use OCPs for contraception and Rx for Sprintec was sent.   Luvenia Redden, PA-C 07/06/2018 3:21 PM

## 2018-07-20 ENCOUNTER — Encounter: Payer: Self-pay | Admitting: *Deleted

## 2018-09-20 ENCOUNTER — Encounter (HOSPITAL_COMMUNITY): Payer: Self-pay | Admitting: Emergency Medicine

## 2018-09-20 ENCOUNTER — Other Ambulatory Visit: Payer: Self-pay

## 2018-09-20 ENCOUNTER — Inpatient Hospital Stay (HOSPITAL_COMMUNITY)
Admission: EM | Admit: 2018-09-20 | Discharge: 2018-09-20 | Disposition: A | Discharge status: Home / Self Care | Payer: Medicaid Other | Attending: Obstetrics & Gynecology | Admitting: Obstetrics & Gynecology

## 2018-09-20 ENCOUNTER — Inpatient Hospital Stay (HOSPITAL_COMMUNITY): Payer: Medicaid Other

## 2018-09-20 DIAGNOSIS — O9989 Other specified diseases and conditions complicating pregnancy, childbirth and the puerperium: Secondary | ICD-10-CM | POA: Insufficient documentation

## 2018-09-20 DIAGNOSIS — R109 Unspecified abdominal pain: Secondary | ICD-10-CM | POA: Insufficient documentation

## 2018-09-20 DIAGNOSIS — N83202 Unspecified ovarian cyst, left side: Secondary | ICD-10-CM

## 2018-09-20 DIAGNOSIS — Z3491 Encounter for supervision of normal pregnancy, unspecified, first trimester: Secondary | ICD-10-CM

## 2018-09-20 DIAGNOSIS — O26899 Other specified pregnancy related conditions, unspecified trimester: Secondary | ICD-10-CM

## 2018-09-20 DIAGNOSIS — Z3A01 Less than 8 weeks gestation of pregnancy: Secondary | ICD-10-CM

## 2018-09-20 LAB — WET PREP, GENITAL
Sperm: NONE SEEN
Trich, Wet Prep: NONE SEEN
Yeast Wet Prep HPF POC: NONE SEEN

## 2018-09-20 LAB — I-STAT BETA HCG BLOOD, ED (MC, WL, AP ONLY): I-stat hCG, quantitative: >2000 m[IU]/mL — ABNORMAL HIGH (ref <5)

## 2018-09-20 LAB — URINALYSIS, ROUTINE W REFLEX MICROSCOPIC
Bilirubin Urine: NEGATIVE
Glucose, UA: NEGATIVE mg/dL
Hgb urine dipstick: NEGATIVE
Ketones, ur: 5 mg/dL — AB
Nitrite: NEGATIVE
Protein, ur: 30 mg/dL — AB
Specific Gravity, Urine: 1.032 — ABNORMAL HIGH (ref 1.005–1.030)
pH: 6 (ref 5.0–8.0)

## 2018-09-20 LAB — COMPREHENSIVE METABOLIC PANEL
ALT: 15 U/L (ref 0–44)
AST: 19 U/L (ref 15–41)
Albumin: 3.6 g/dL (ref 3.5–5.0)
Alkaline Phosphatase: 35 U/L — ABNORMAL LOW (ref 38–126)
Anion gap: 10 (ref 5–15)
BUN: 18 mg/dL (ref 6–20)
CO2: 21 mmol/L — ABNORMAL LOW (ref 22–32)
Calcium: 8.9 mg/dL (ref 8.9–10.3)
Chloride: 105 mmol/L (ref 98–111)
Creatinine, Ser: 0.92 mg/dL (ref 0.44–1.00)
GFR calc Af Amer: >60 mL/min (ref >60)
GFR calc non Af Amer: >60 mL/min (ref >60)
Glucose, Bld: 99 mg/dL (ref 70–99)
Potassium: 3.3 mmol/L — ABNORMAL LOW (ref 3.5–5.1)
Sodium: 136 mmol/L (ref 135–145)
Total Bilirubin: 0.6 mg/dL (ref 0.3–1.2)
Total Protein: 6.7 g/dL (ref 6.5–8.1)

## 2018-09-20 LAB — CBC
HCT: 38.1 % (ref 36.0–46.0)
Hemoglobin: 12.7 g/dL (ref 12.0–15.0)
MCH: 31.1 pg (ref 26.0–34.0)
MCHC: 33.3 g/dL (ref 30.0–36.0)
MCV: 93.2 fL (ref 80.0–100.0)
Platelets: 279 10*3/uL (ref 150–400)
RBC: 4.09 MIL/uL (ref 3.87–5.11)
RDW: 12.9 % (ref 11.5–15.5)
WBC: 8.4 10*3/uL (ref 4.0–10.5)
nRBC: 0 % (ref 0.0–0.2)

## 2018-09-20 LAB — LIPASE, BLOOD: Lipase: 30 U/L (ref 11–51)

## 2018-09-20 MED ORDER — SODIUM CHLORIDE 0.9% FLUSH: 3.0000 mL | Freq: Once | INTRAVENOUS | Status: DC

## 2018-09-20 NOTE — ED Notes (Addendum)
Pt's hCG +/ spoke with CJ at MAU and transport called to take pt to MAU.

## 2018-09-20 NOTE — ED Triage Notes (Signed)
C/o RLQ pain x 2 weeks.  Reports headache that started today.  Denies nausea, vomiting, and diarrhea.  Denies urinary complaints.

## 2018-09-20 NOTE — Discharge Instructions (Signed)
[facility]iders    [facility]       Phone: [phone]  [facility]   Phone: [phone]  [facility]  Phone: [phone]  [facility]  Phone: [phone]  [facility]  Phone: [phone]  [facility]   Phone: [phone]  [facility]       Phone: [phone]  [facility]    Phone: [phone]   [facility]    Phone: [phone]  [facility]    Phone: [phone]  [facility]    Phone: [phone]  [facility]       Phone: [phone]   [facility]  Phone: [phone]  [facility]    Phone: [phone]  [facility]   Phone: [phone]  [facility]      Phone: [phone]  [facility]    Phone: [phone]   

## 2018-09-20 NOTE — MAU Note (Signed)
Sharp lower abdominal pain-bilateral but mostly on the right side.  Started feeling the pain 2 weeks ago but it has progressed.  No VB.  LMP around August 1st.  Took ibuprofen for the pain at 1800-no relief.

## 2018-09-22 LAB — GC/CHLAMYDIA PROBE AMP (~~LOC~~) NOT AT ARMC
Chlamydia: NEGATIVE
Neisseria Gonorrhea: NEGATIVE

## 2018-09-28 NOTE — MAU Provider Note (Signed)
Late entry  History     CSN: [id number]  Arrival date and time: 09/20/18 [id number]   First Provider Initiated Contact with Patient 09/20/18 0316      Chief Complaint  Patient presents with  . Abdominal Pain  . Headache   Rhonda Barron is a 33 y.o. G3P2 early pregnant patient who presents to MAU with complaints of abdominal pain. She was sent over from [facility] after finding out she was pregnant. She reports abdominal pain has been occurring for the past 2 weeks. She describes the pain as sharp lower abdominal pain that occurs bilaterally but has more pain on right. She denies vaginal bleeding or discharge.    OB History    Gravida  3   Para  2   Term  2   Preterm  0   AB  0   Living  2     SAB  0   TAB  0   Ectopic  0   Multiple  0   Live Births  2           Past Medical History:  Diagnosis Date  . Abnormal Pap smear   . History of chlamydia     Past Surgical History:  Procedure Laterality Date  . CESAREAN SECTION N/A 01/05/2015   Procedure: CESAREAN SECTION;  Surgeon: Ena Dawley, MD;  Location: [facility];  Service: Obstetrics;  Laterality: N/A;  . INGUINAL HERNIA PEDIATRIC WITH LAPAROSCOPIC EXAM    . NO PAST SURGERIES      Family History  Problem Relation Age of Onset  . Hypertension Father   . Diabetes Father   . Asthma Brother   . Stroke Paternal Uncle   . Anesthesia problems Neg Hx   . Hypotension Neg Hx   . Pseudochol deficiency Neg Hx   . Malignant hyperthermia Neg Hx     Social History   Tobacco Use  . Smoking status: Never Smoker  . Smokeless tobacco: Never Used  Substance Use Topics  . Alcohol use: Yes    Comment: occ  . Drug use: No    Allergies:  Allergies  Allergen Reactions  . Latex Itching    No medications prior to admission.    Review of Systems  Constitutional: Negative.   Respiratory: Negative.   Cardiovascular: Negative.   Gastrointestinal: Positive for abdominal pain. Negative for constipation, diarrhea,  nausea and vomiting.  Genitourinary: Negative.   Musculoskeletal: Negative.   Neurological: Negative.    Physical Exam   Blood pressure 132/74, pulse 64, temperature 98.9 F (37.2 C), resp. rate 19, weight 110.7 kg, last menstrual period 08/16/2018, SpO2 100 %, unknown if currently breastfeeding.  Physical Exam  Nursing note and vitals reviewed. Constitutional: She is oriented to person, place, and time. She appears well-developed and well-nourished. No distress.  HENT:  Head: Normocephalic.  Cardiovascular: Normal rate and regular rhythm.  Respiratory: Effort normal and breath sounds normal. No respiratory distress. She has no wheezes.  GI: Soft. She exhibits no distension. There is no abdominal tenderness. There is no rebound.  Genitourinary:    Genitourinary Comments: Wet prep obtained- blind swab   Neurological: She is alert and oriented to person, place, and time.  Psychiatric: She has a normal mood and affect. Her behavior is normal. Thought content normal.    MAU Course  Procedures  MDM Wet prep and [place] ordered  All other lab work obtained by [employer] and reviewed   Wet prep +clue cells, will not treat for  BV patient denies vaginal discharge or odor   Results of [place] report reviewed: GS, yolk sac and embryo visualized. No cardiac activity at this time. Finding suspicious but not yet definitive for failed pregnancy. Recommends follow up US for definitive diagnoses.   Discussed results of [place] with patient. Educated on reasons to present back to MAU. Threatened miscarriage precautions given. Pt stable at time of discharge.   Assessment and Plan   1. Normal intrauterine pregnancy on prenatal ultrasound in first trimester   2. Abdominal pain during pregnancy   3. Cyst of left ovary   4. [redacted] weeks gestation of pregnancy    Discharge home Encouraged to make initial prenatal appointment  Follow up US to assess viability  Return to MAU as needed Educated on left ovarian cyst found  on [place]- simple cyst   Lajean Manes CNM 09/28/2018, 4:14 PM

## 2018-10-05 ENCOUNTER — Ambulatory Visit (HOSPITAL_COMMUNITY): Admission: RE | Admit: 2018-10-05 | Payer: Medicaid Other | Source: Ambulatory Visit

## 2018-10-05 ENCOUNTER — Ambulatory Visit: Payer: Medicaid Other

## 2018-11-03 ENCOUNTER — Encounter (HOSPITAL_COMMUNITY): Payer: Self-pay | Admitting: *Deleted

## 2018-11-03 ENCOUNTER — Inpatient Hospital Stay (HOSPITAL_COMMUNITY)
Admission: AD | Admit: 2018-11-03 | Discharge: 2018-11-03 | Disposition: A | Discharge status: Home / Self Care | Payer: Medicaid Other | Attending: Obstetrics & Gynecology | Admitting: Obstetrics & Gynecology

## 2018-11-03 ENCOUNTER — Other Ambulatory Visit: Payer: Self-pay

## 2018-11-03 DIAGNOSIS — Z3202 Encounter for pregnancy test, result negative: Secondary | ICD-10-CM | POA: Diagnosis not present

## 2018-11-03 DIAGNOSIS — N939 Abnormal uterine and vaginal bleeding, unspecified: Secondary | ICD-10-CM | POA: Diagnosis present

## 2018-11-03 DIAGNOSIS — R109 Unspecified abdominal pain: Secondary | ICD-10-CM

## 2018-11-03 DIAGNOSIS — R519 Headache, unspecified: Secondary | ICD-10-CM | POA: Diagnosis not present

## 2018-11-03 LAB — URINALYSIS, ROUTINE W REFLEX MICROSCOPIC
Bilirubin Urine: NEGATIVE
Glucose, UA: NEGATIVE mg/dL
Ketones, ur: NEGATIVE mg/dL
Nitrite: NEGATIVE
Protein, ur: 30 mg/dL — AB
Specific Gravity, Urine: 1.031 — ABNORMAL HIGH (ref 1.005–1.030)
pH: 7 (ref 5.0–8.0)

## 2018-11-03 LAB — POCT PREGNANCY, URINE: Preg Test, Ur: NEGATIVE

## 2018-11-03 LAB — HCG, QUANTITATIVE, PREGNANCY: hCG, Beta Chain, Quant, S: 6 m[IU]/mL — ABNORMAL HIGH (ref <5)

## 2018-11-03 NOTE — Discharge Instructions (Signed)
Human Chorionic Gonadotropin Test Why am I having this test? A human chorionic gonadotropin (hCG) test is done to determine whether you are pregnant. It can also be used:  To diagnose an abnormal pregnancy.  To determine whether you have had a failed pregnancy (miscarriage) or are at risk of one. What is being tested? This test checks the level of the human chorionic gonadotropin (hCG) hormone in the blood. This hormone is produced during pregnancy by the cells that form the placenta. The placenta is the organ that grows inside your womb (uterus) to nourish a developing baby. When you are pregnant, hCG can be detected in your blood or urine 7 to 8 days before your missed period. It continues to go up for the first 8-10 weeks of pregnancy. The presence of hCG in your blood can be measured with several different types of tests. You may have:  A urine test. ? Because this hormone is eliminated from your body by your kidneys, you may have a urine test to find out whether you are pregnant. A home pregnancy test detects whether there is hCG in your urine. ? A urine test only shows whether there is hCG in your urine. It does not measure how much.  A qualitative blood test. ? You may have this type of blood test to find out if you are pregnant. ? This blood test only shows whether there is hCG in your blood. It does not measure how much.  A quantitative blood test. ? This type of blood test measures the amount of hCG in your blood. ? You may have this test to:  Diagnose an abnormal pregnancy.  Check whether you have had a miscarriage.  Determine whether you are at risk of a miscarriage. What kind of sample is taken?     Two kinds of samples may be collected to test for the hCG hormone.  Blood. It is usually collected by inserting a needle into a blood vessel.  Urine. It is usually collected by urinating into a germ-free (sterile) specimen cup. It is best to collect the sample the first  time you urinate in the morning. How do I prepare for this test? No preparation is needed for a blood test.  For the urine test:  Let your health care provider know about: ? All medicines you are taking, including vitamins, herbs, creams, and over-the-counter medicines. ? Any blood in your urine. This may interfere with the result.  Do not drink too much fluid. Drink as you normally would, or as directed by your health care provider. How are the results reported? Depending on the type of test that you have, your test results may be reported as values. Your health care provider will compare your results to normal ranges that were established after testing a large group of people (reference ranges). Reference ranges may vary among labs and hospitals. For this test, common reference ranges that show absence of pregnancy are:  Quantitative hCG blood levels: less than 5 IU/L. Other results will be reported as either positive or negative. For this test, normal results (meaning the absence of pregnancy) are:  Negative for hCG in the urine test.  Negative for hCG in the qualitative blood test. What do the results mean? Urine and qualitative blood test  A negative result could mean: ? That you are not pregnant. ? That the test was done too early in your pregnancy to detect hCG in your blood or urine. If you still have other signs   of pregnancy, the test will be repeated. °· A positive result means: °? That you are most likely pregnant. Your health care provider may confirm your pregnancy with an imaging study (ultrasound) of your uterus, if needed. °Quantitative blood test °Results of the quantitative hCG blood test will be interpreted as follows: °· Less than 5 IU/L: You are most likely not pregnant. °· Greater than 25 IU/L: You are most likely pregnant. °· hCG levels that are higher than expected: °? You are pregnant with twins. °? You have abnormal growths in the uterus. °· hCG levels that are  rising more slowly than expected: °? You have an ectopic pregnancy (also called a tubal pregnancy). °· hCG levels that are falling: °? You may be having a miscarriage. °Talk with your health care provider about what your results mean. °Questions to ask your health care provider °Ask your health care provider, or the department that is doing the test: °· When will my results be ready? °· How will I get my results? °· What are my treatment options? °· What other tests do I need? °· What are my next steps? °Summary °· A human chorionic gonadotropin test is done to determine whether you are pregnant. °· When you are pregnant, hCG can be detected in your blood or urine 7 to 8 days before your missed period. It continues to go up for the first 8-10 weeks of pregnancy. °· Your hCG level can be measured with different types of tests. You may have a urine test, a qualitative blood test, or a quantitative blood test. °· Talk with your health care provider about what your results mean. °This information is not intended to replace advice given to you by your health care provider. Make sure you discuss any questions you have with your health care provider. °Document Released: 02/01/2004 Document Revised: 12/01/2016 Document Reviewed: 12/01/2016 °Elsevier Patient Education © 2020 [employer] ° °

## 2018-11-03 NOTE — MAU Provider Note (Signed)
First Provider Initiated Contact with Patient 11/03/18 1233     S [name] is a 33 y.o. G11P2002 female who presents to MAU today with multiple complaints including bloating, spotting, headache and nausea. She denies abdominal pain, vaginal bleeding, abdominal tenderness, back pain, fever or recent illness.  Patient is s/p elective termination of pregnancy on 09/28/18. She states she started Nuvaring at the end of September but discontinued after two weeks due to the onset of aforementioned symptoms.  Patient states she had a well woman appointment at [facility] last Thursday 10/28/18. She reports a positive pregnancy test and a Quant hCG of 12 at that appointment.  O BP 135/75 (BP Location: Right Arm)   Pulse 76   Temp 98.4 F (36.9 C) (Oral)   Resp 18   Wt 108.5 kg   LMP 08/16/2018   SpO2 100%   Breastfeeding Unknown   BMI 37.46 kg/m    Physical Exam  Nursing note and vitals reviewed. Constitutional: She is oriented to person, place, and time. She appears well-developed and well-nourished.  Cardiovascular: Normal rate.  Respiratory: Effort normal. No respiratory distress.  GI: Soft.  Neurological: She is alert and oriented to person, place, and time.  Skin: Skin is warm and dry.  Psychiatric: She has a normal mood and affect. Her behavior is normal. Judgment and thought content normal.   A Negative urine pregnancy test Serum Quant hCG of 6 in MAU today, down from 12 last Thursday 10/28/18 Medical screening exam complete Likely associated with resolving pregnancy s/p termination 09/28/18 Plan of care reviewed with Dr. Harolyn Rutherford prior to patient discharge  P --Patient to have non-stat Quant hCG in one week. Message sent to [facility] --Bleeding/ectopic precautions reviewed with patient --Discharge from MAU in stable condition --Patient may return to MAU as needed for pregnancy related complaints  Mallie Snooks, CNM 11/03/2018 2:41 PM

## 2018-11-03 NOTE — MAU Note (Signed)
Had an abortion, Sept 15.  A month later they rechecked, test was neg.  Started nuvaring.  Been having headaches, spotting, nausea and some bloating.  preg feeling all over again.  Went back to Dr, they couldn't answer questions or what was going on, [facility]

## 2018-11-04 ENCOUNTER — Other Ambulatory Visit: Payer: Self-pay | Admitting: Lactation Services

## 2018-11-04 DIAGNOSIS — Z3202 Encounter for pregnancy test, result negative: Secondary | ICD-10-CM

## 2018-11-10 ENCOUNTER — Other Ambulatory Visit: Payer: Self-pay

## 2018-11-10 ENCOUNTER — Other Ambulatory Visit: Payer: Medicaid Other

## 2018-11-10 DIAGNOSIS — Z3202 Encounter for pregnancy test, result negative: Secondary | ICD-10-CM

## 2018-11-11 LAB — BETA HCG QUANT (REF LAB): hCG Quant: 1 m[IU]/mL

## 2019-02-14 LAB — PREGNANCY, URINE: Preg Test, Ur: POSITIVE

## 2019-02-21 ENCOUNTER — Telehealth: Payer: Self-pay | Admitting: Family Medicine

## 2019-02-21 NOTE — Telephone Encounter (Signed)
Attempted to contact patient to give her appointments to start prenatal care in our office. No answer, voicemail was left for patient with her intake appointment (3/17 @ 1:30). Patient instructed to give the office a call back if she is needing to rescheduled. Reminder mailed

## 2019-03-02 ENCOUNTER — Ambulatory Visit: Payer: Medicaid Other

## 2019-03-24 ENCOUNTER — Ambulatory Visit: Payer: Medicaid Other | Admitting: Obstetrics & Gynecology

## 2019-03-29 ENCOUNTER — Encounter: Payer: Self-pay | Admitting: *Deleted

## 2019-03-29 ENCOUNTER — Ambulatory Visit (INDEPENDENT_AMBULATORY_CARE_PROVIDER_SITE_OTHER): Payer: Medicaid Other | Admitting: *Deleted

## 2019-03-29 ENCOUNTER — Other Ambulatory Visit: Payer: Self-pay

## 2019-03-29 DIAGNOSIS — Z3490 Encounter for supervision of normal pregnancy, unspecified, unspecified trimester: Secondary | ICD-10-CM

## 2019-03-29 NOTE — Progress Notes (Signed)
1:29 I called Rhonda Barron for her New OB Intake telephone visit. I left a message I am calling for her telephone visit and will call again in  A few minutes; please be available by phone.  [name],RN  1:36 I called Rhonda Barron again for her New OB Intake telephone visit  and left a message I was calling again for your telephone visit and since I did not reach you; it will need to be rescheduled by calling our office. Also I will try your contact number to see If I can reach you. I called her contact number for Rhonda Barron and left a message I am trying to reach Rhonda Barron and you are listed as a contact- please give her message we are trying to reach her regarding her appointment and to call us please. [name],RN  1:41 I called Rhonda Barron again and got voicemail again; I did not leave another message.  [name],RN

## 2019-04-04 ENCOUNTER — Encounter: Payer: Self-pay | Admitting: *Deleted

## 2019-04-05 ENCOUNTER — Encounter: Payer: Medicaid Other | Admitting: Advanced Practice Midwife

## 2019-04-14 LAB — OB RESULTS CONSOLE GC/CHLAMYDIA
Chlamydia: NEGATIVE
Gonorrhea: NEGATIVE

## 2019-04-14 LAB — OB RESULTS CONSOLE RUBELLA ANTIBODY, IGM: Rubella: NON-IMMUNE/NOT IMMUNE

## 2019-04-14 LAB — OB RESULTS CONSOLE HIV ANTIBODY (ROUTINE TESTING): HIV: NONREACTIVE

## 2019-04-14 LAB — OB RESULTS CONSOLE RPR: RPR: NONREACTIVE

## 2019-04-14 LAB — OB RESULTS CONSOLE ANTIBODY SCREEN: Antibody Screen: NEGATIVE

## 2019-04-14 LAB — OB RESULTS CONSOLE HEPATITIS B SURFACE ANTIGEN: Hepatitis B Surface Ag: NEGATIVE

## 2019-04-14 LAB — OB RESULTS CONSOLE ABO/RH: RH Type: POSITIVE

## 2019-05-11 ENCOUNTER — Other Ambulatory Visit: Payer: Self-pay

## 2019-05-11 ENCOUNTER — Ambulatory Visit (HOSPITAL_COMMUNITY)
Admission: EM | Admit: 2019-05-11 | Discharge: 2019-05-11 | Disposition: A | Discharge status: Home / Self Care | Payer: Medicaid Other | Attending: Emergency Medicine | Admitting: Emergency Medicine

## 2019-05-11 ENCOUNTER — Encounter (HOSPITAL_COMMUNITY): Payer: Self-pay

## 2019-05-11 DIAGNOSIS — Z20822 Contact with and (suspected) exposure to covid-19: Secondary | ICD-10-CM | POA: Diagnosis present

## 2019-05-11 DIAGNOSIS — U071 COVID-19: Secondary | ICD-10-CM | POA: Diagnosis not present

## 2019-05-11 DIAGNOSIS — Z1152 Encounter for screening for COVID-19: Secondary | ICD-10-CM

## 2019-05-11 DIAGNOSIS — R509 Fever, unspecified: Secondary | ICD-10-CM | POA: Insufficient documentation

## 2019-05-11 MED ORDER — ACETAMINOPHEN 325 MG PO TABS
ORAL_TABLET | ORAL | Status: AC
  Filled 2019-05-11: qty 2

## 2019-05-11 MED ORDER — GUAIFENESIN 100 MG/5ML PO LIQD: 200.0000 mg | Freq: Three times a day (TID) | ORAL | 0 refills | Status: DC | PRN

## 2019-05-11 MED ORDER — ACETAMINOPHEN 325 MG PO TABS
650.0000 mg | ORAL_TABLET | Freq: Once | ORAL | Status: AC
  Administered 2019-05-11: 650 mg via ORAL

## 2019-05-11 MED ORDER — ONDANSETRON 4 MG PO TBDP
ORAL_TABLET | ORAL | Status: AC
  Filled 2019-05-11: qty 1

## 2019-05-11 NOTE — Discharge Instructions (Addendum)
COVID testing ordered.  It will take between 2-7 days for test results.  Someone will contact you regarding abnormal results.    In the meantime: You should remain isolated in your home for 10 days from symptom onset AND greater than 24 hours after symptoms resolution (absence of fever without the use of fever-reducing medication and improvement in respiratory symptoms), whichever is longer Get plenty of rest and push fluids May get cough drops for cough Guaifenesin prescribed for congestion Use OTC medications like tylenol as needed fever or pain Call or go to the ED if you have any new or worsening symptoms such as increasing fever, worsening seizure, cough, shortness of breath, chest tightness, chest pain, turning blue, changes in mental status, etc..Marland Kitchen

## 2019-05-11 NOTE — ED Triage Notes (Signed)
Pt is here with nasal congestion, cough, fever, headache for 2 days now. Pt is 4 months pregnant, pt has taken Zrytec to relieve discomfort.

## 2019-05-11 NOTE — ED Provider Notes (Signed)
[place]    CSN: [id number] Arrival date & time: 05/11/19  1825      History   Chief Complaint Chief Complaint  Patient presents with  . Cough  . Fever  . Nasal Congestion    HPI Rhonda Barron is a 34 y.o. female.   [redacted] weeks pregnant presented to the urgent care for complaint of fever, nasal congestion, cough, headache for the past 2 days denies sick exposure to COVID, flu or strep.  Denies recent travel.  Denies aggravating or alleviating symptoms.  Denies previous COVID infection.   Denies  chills, fatigue, rhinorrhea, sore throat, SOB, wheezing, chest pain, nausea, vomiting, changes in bowel or bladder habits.    The history is provided by the patient. No language interpreter was used.    Past Medical History:  Diagnosis Date  . Abnormal Pap smear   . History of chlamydia     Patient Active Problem List   Diagnosis Date Noted  . 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 Inguinal hernia - pediatric w/ laparscopic exam 05/15/2014  . Obese 12/23/2010  . History of abnormal Pap smear 12/23/2010    Past Surgical History:  Procedure Laterality Date  . CESAREAN SECTION N/A 01/05/2015   Procedure: CESAREAN SECTION;  Surgeon: Ena Dawley, MD;  Location: [facility];  Service: Obstetrics;  Laterality: N/A;  . INGUINAL HERNIA PEDIATRIC WITH LAPAROSCOPIC EXAM    . NO PAST SURGERIES      OB History    Gravida  4   Para  2   Term  2   Preterm  0   AB  0   Living  2     SAB  0   TAB  0   Ectopic  0   Multiple  0   Live Births  2            Home Medications    Prior to Admission medications   Medication Sig Start Date End Date Taking? Authorizing Provider  acetaminophen (TYLENOL) 500 MG tablet Take 1,000 mg by mouth every 6 (six) hours as needed for mild pain.    [provider]  acetaminophen (TYLENOL) 500 MG tablet Tylenol Extra Strength 500 mg tablet  Take 1 tablet as needed by  oral route.    [provider]  amoxicillin (AMOXIL) 500 MG capsule amoxicillin 500 mg capsule  TK 1 C PO TID    [provider]  ciprofloxacin (CIPRO) 250 MG tablet Take 250 mg by mouth 2 (two) times daily. 12/30/18   [provider]  clindamycin (CLEOCIN) 300 MG capsule clindamycin HCl 300 mg capsule  TK ONE C PO  TID X 7 DAYS    [provider]  doxycycline (VIBRAMYCIN) 100 MG capsule doxycycline hyclate 100 mg capsule  TK ONE C PO BID    [provider]  ergocalciferol (VITAMIN D2) 1.25 MG (50000 UT) capsule ergocalciferol (vitamin D2) 1,250 mcg (50,000 unit) capsule  TK ONE C PO WEEKLY    [provider]  fluconazole (DIFLUCAN) 150 MG tablet fluconazole 150 mg tablet  TK 1 T PO 1 TIME. REPEAT IN 3 DAYS    [provider]  guaiFENesin (ROBITUSSIN) 100 MG/5ML liquid Take 10 mLs (200 mg total) by mouth 3 (three) times daily as needed for cough. 05/11/19   Terryn Rosenkranz, Darrelyn Hillock, FNP  metroNIDAZOLE (FLAGYL) 250 MG tablet metronidazole 250 mg tablet  TK 1 T PO TID  [provider]  nitrofurantoin, macrocrystal-monohydrate, (MACROBID) 100 MG capsule Take 100 mg by mouth 2 (two) times daily. 12/23/18   [provider]  oseltamivir (TAMIFLU) 75 MG capsule Take 75 mg by mouth 2 (two) times daily. 02/18/19   [provider]  Prenatal Vit-Fe Fumarate-FA (PRENATAL VITAMINS PO) Prenatal Vitamin  1 tablet po daily    [provider]  sulfamethoxazole-trimethoprim (BACTRIM DS) 800-160 MG tablet sulfamethoxazole 800 mg-trimethoprim 160 mg tablet  TK 1 T PO  BID    [provider]  valACYclovir (VALTREX) 1000 MG tablet valacyclovir 1 gram tablet  TK 1 T PO BID FOR 7 DAYS    [provider]    Family History Family History  Problem Relation Age of Onset  . Hypertension Father   . Diabetes Father   . Asthma Brother   . Stroke Paternal Uncle   . Healthy Mother   . Anesthesia problems Neg  Hx   . Hypotension Neg Hx   . Pseudochol deficiency Neg Hx   . Malignant hyperthermia Neg Hx     Social History Social History   Tobacco Use  . Smoking status: Never Smoker  . Smokeless tobacco: Never Used  Substance Use Topics  . Alcohol use: Yes    Comment: occ  . Drug use: No     Allergies   Latex   Review of Systems Review of Systems  Constitutional: Positive for chills and fever.  HENT: Positive for congestion.   Respiratory: Positive for cough.   Cardiovascular: Negative.   Gastrointestinal: Negative.   Neurological: Positive for headaches.     Physical Exam Triage Vital Signs ED Triage Vitals  Enc Vitals Group     BP      Pulse      Resp      Temp      Temp src      SpO2      Weight      Height      Head Circumference      Peak Flow      Pain Score      Pain Loc      Pain Edu?      Excl. in [place]?    No data found.  Updated Vital Signs BP 118/69 (BP Location: Right Arm)   Pulse 89   Temp (!) 100.5 F (38.1 C) (Oral)   Resp 18   Wt 271 lb 6.4 oz (123.1 kg)   LMP 01/15/2019   SpO2 100%   Breastfeeding No   BMI 42.51 kg/m   Visual Acuity Right Eye Distance:   Left Eye Distance:   Bilateral Distance:    Right Eye Near:   Left Eye Near:    Bilateral Near:     Physical Exam Vitals and nursing note reviewed.  Constitutional:      General: She is not in acute distress.    Appearance: Normal appearance. She is normal weight. She is not ill-appearing or toxic-appearing.  HENT:     Head: Normocephalic.     Right Ear: Tympanic membrane, ear canal and external ear normal. There is no impacted cerumen.     Left Ear: Tympanic membrane, ear canal and external ear normal. There is no impacted cerumen.     Nose: Nose normal. No congestion.     Mouth/Throat:     Mouth: Mucous membranes are moist.     Pharynx: Oropharynx is clear. No oropharyngeal exudate.  Cardiovascular:     Rate  and Rhythm: Regular rhythm. Tachycardia present.     Pulses:  Normal pulses.     Heart sounds: Normal heart sounds. No murmur.  Pulmonary:     Effort: Pulmonary effort is normal. No respiratory distress.     Breath sounds: Normal breath sounds. No wheezing or rhonchi.  Chest:     Chest wall: No tenderness.  Abdominal:     General: Abdomen is flat. Bowel sounds are normal.     Palpations: Abdomen is soft.  Neurological:     Mental Status: She is alert and oriented to person, place, and time.      UC Treatments / Results  Labs (all labs ordered are listed, but only abnormal results are displayed) Labs Reviewed  SARS CORONAVIRUS 2 (TAT 6-24 HRS)    EKG   Radiology No results found.  Procedures Procedures (including critical care time)  Medications Ordered in UC Medications  acetaminophen (TYLENOL) tablet 650 mg (650 mg Oral Given 05/11/19 1935)    Initial Impression / Assessment and Plan / UC Course  I have reviewed the triage vital signs and the nursing notes.  Pertinent labs & imaging results that were available during my care of the patient were reviewed by me and considered in my medical decision making (see chart for details).    Patient is stable at discharge not ill-appearing.  Was advised to go to ED for worsening of symptoms such as uncontrolled fever.  Advised to take OTC Tylenol every 4 hours as needed for fever.  Follow-up with OB/GYN tomorrow.  Final Clinical Impressions(s) / UC Diagnoses   Final diagnoses:  Fever, unknown origin  Encounter for screening for COVID-19     Discharge Instructions     COVID testing ordered.  It will take between 2-7 days for test results.  Someone will contact you regarding abnormal results.    In the meantime: You should remain isolated in your home for 10 days from symptom onset AND greater than 24 hours after symptoms resolution (absence of fever without the use of fever-reducing medication and improvement in respiratory symptoms), whichever is longer Get plenty of rest and  push fluids May get cough drops for cough Guaifenesin prescribed for congestion Use OTC medications like tylenol as needed fever or pain Call or go to the ED if you have any new or worsening symptoms such as increasing fever, worsening seizure, cough, shortness of breath, chest tightness, chest pain, turning blue, changes in mental status, etc...     ED Prescriptions    Medication Sig Dispense Auth. Provider   guaiFENesin (ROBITUSSIN) 100 MG/5ML liquid Take 10 mLs (200 mg total) by mouth 3 (three) times daily as needed for cough. 120 mL Bani Gianfrancesco, Darrelyn Hillock, FNP     PDMP not reviewed this encounter.   Emerson Monte, FNP 05/11/19 1947

## 2019-05-12 LAB — SARS CORONAVIRUS 2 (TAT 6-24 HRS): SARS Coronavirus 2: POSITIVE — AB

## 2019-05-13 ENCOUNTER — Telehealth: Payer: Self-pay | Admitting: Physician Assistant

## 2019-05-13 ENCOUNTER — Encounter: Payer: Self-pay | Admitting: Physician Assistant

## 2019-05-13 NOTE — Telephone Encounter (Signed)
Called to discuss with Joellyn A Dumas about Covid symptoms and the use of bamlanivimab/etesevimab or casirivimab/imdevimab, a monoclonal antibody infusion for those with mild to moderate Covid symptoms and at a high risk of hospitalization.     Pt is qualified for this infusion at the [facility] infusion center due to co-morbid conditions and/or a member of an at-risk group, however, pt is pregnant so she does not qualify. Symptoms tier reviewed as well as criteria for ending isolation.  Symptoms reviewed that would warrant ED/Hospital evaluation. Preventative practices reviewed. Patient verbalized understanding. Patient advised to call back if he decides that he does want to get infusion. Callback number to the infusion center given. Patient advised to go to Urgent care or ED with severe symptoms. However, pt is pregnant so she does not qualify.    Patient Active Problem List   Diagnosis Date Noted  . Morbid obesity ([place])   . 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 Inguinal hernia - pediatric w/ laparscopic exam 05/15/2014  . Obese 12/23/2010  . History of abnormal Pap smear 12/23/2010    Angelena Form PA-C

## 2019-05-26 ENCOUNTER — Encounter (HOSPITAL_COMMUNITY): Payer: Self-pay | Admitting: Obstetrics & Gynecology

## 2019-05-26 ENCOUNTER — Other Ambulatory Visit: Payer: Self-pay

## 2019-05-26 ENCOUNTER — Inpatient Hospital Stay (HOSPITAL_COMMUNITY)
Admission: AD | Admit: 2019-05-26 | Discharge: 2019-05-26 | Disposition: A | Discharge status: Home / Self Care | Payer: Medicaid Other | Attending: Obstetrics & Gynecology | Admitting: Obstetrics & Gynecology

## 2019-05-26 DIAGNOSIS — R1010 Upper abdominal pain, unspecified: Secondary | ICD-10-CM | POA: Insufficient documentation

## 2019-05-26 DIAGNOSIS — Z8616 Personal history of COVID-19: Secondary | ICD-10-CM | POA: Insufficient documentation

## 2019-05-26 DIAGNOSIS — O26892 Other specified pregnancy related conditions, second trimester: Secondary | ICD-10-CM | POA: Insufficient documentation

## 2019-05-26 DIAGNOSIS — R519 Headache, unspecified: Secondary | ICD-10-CM | POA: Insufficient documentation

## 2019-05-26 DIAGNOSIS — O99212 Obesity complicating pregnancy, second trimester: Secondary | ICD-10-CM | POA: Diagnosis not present

## 2019-05-26 DIAGNOSIS — Z3A18 18 weeks gestation of pregnancy: Secondary | ICD-10-CM | POA: Diagnosis not present

## 2019-05-26 DIAGNOSIS — O368120 Decreased fetal movements, second trimester, not applicable or unspecified: Secondary | ICD-10-CM | POA: Diagnosis not present

## 2019-05-26 DIAGNOSIS — Z3492 Encounter for supervision of normal pregnancy, unspecified, second trimester: Secondary | ICD-10-CM

## 2019-05-26 LAB — URINALYSIS, ROUTINE W REFLEX MICROSCOPIC
Bilirubin Urine: NEGATIVE
Glucose, UA: NEGATIVE mg/dL
Hgb urine dipstick: NEGATIVE
Ketones, ur: NEGATIVE mg/dL
Leukocytes,Ua: NEGATIVE
Nitrite: NEGATIVE
Protein, ur: NEGATIVE mg/dL
Specific Gravity, Urine: 1.024 (ref 1.005–1.030)
pH: 7 (ref 5.0–8.0)

## 2019-05-26 NOTE — MAU Provider Note (Signed)
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 She reports feeling no movements the past two days. She denies vaginal bleeding, vaginal discharge, LOF, pain with urination, HA, nausea, vomiting, visual disturbances. She has had headaches in the past and also high blood pressure runs in her family so she came in to get checked out for pre-eclampsia.   She tested positive for COVID-19 on April 29; she has had two negative tests since then.   History     CSN: [id number]  Arrival date and time: 05/26/19 [id number]   First Provider Initiated Contact with Patient 05/26/19 0946      Chief Complaint  Patient presents with  . Decreased Fetal Movement  . Headache  . Abdominal Pain   HPI She tested positive for COVID on April 29. She had a fever and headache but now is recovered. Over the past two days she has felt no movement. She tried drinking cold water and laying differently. She normally feels flutters every day, especially when she eats and drinks.Marland Kitchen She was very concerned about COVID affecting her pregnancy so she came in to get checked out. She also wants to know if Vitamin C can cause a miscarriage.  OB History    Gravida  4   Para  2   Term  2   Preterm  0   AB  1   Living  2     SAB  0   TAB  1   Ectopic  0   Multiple  0   Live Births  2           Past Medical History:  Diagnosis Date  . Abnormal Pap smear   . History of chlamydia   . Morbid obesity ([place])     Past Surgical History:  Procedure Laterality Date  . CESAREAN SECTION N/A 01/05/2015   Procedure: CESAREAN SECTION;  Surgeon: Ena Dawley, MD;  Location: [facility];  Service: Obstetrics;  Laterality: N/A;  . INGUINAL HERNIA PEDIATRIC WITH LAPAROSCOPIC EXAM    . NO PAST SURGERIES      Family History  Problem Relation Age of Onset  . Hypertension Father   . Diabetes Father   . Asthma Brother   . Stroke Paternal Uncle   . Healthy Mother    . Anesthesia problems Neg Hx   . Hypotension Neg Hx   . Pseudochol deficiency Neg Hx   . Malignant hyperthermia Neg Hx     Social History   Tobacco Use  . Smoking status: Never Smoker  . Smokeless tobacco: Never Used  Substance Use Topics  . Alcohol use: Yes    Comment: occ  . Drug use: No    Allergies:  Allergies  Allergen Reactions  . Latex Itching    Medications Prior to Admission  Medication Sig Dispense Refill Last Dose  . acetaminophen (TYLENOL) 500 MG tablet Take 500 mg by mouth every 6 (six) hours as needed for mild pain.    05/25/2019 at Unknown time  . Prenatal Vit-Fe Fumarate-FA (PRENATAL VITAMINS PO) Take 1 tablet by mouth daily.    05/26/2019 at Unknown time  . guaiFENesin (ROBITUSSIN) 100 MG/5ML liquid Take 10 mLs (200 mg total) by mouth 3 (three) times daily as needed for cough. (Patient not taking: Reported on 05/26/2019) 120 mL 0 Not Taking at Unknown time    Review of Systems  Constitutional: Negative.   HENT: Negative.   Respiratory: Negative.  Cardiovascular: Negative.   Gastrointestinal: Negative.   Genitourinary: Negative.   Musculoskeletal: Negative.    Physical Exam   Blood pressure 126/76, pulse 92, temperature 98.4 F (36.9 C), resp. rate 18, last menstrual period 01/15/2019, SpO2 99 %.  Physical Exam  Constitutional: She is oriented to person, place, and time. She appears well-developed.  HENT:  Head: Normocephalic.  Respiratory: Effort normal.  GI: Soft.  Musculoskeletal:        General: Normal range of motion.     Cervical back: Normal range of motion.  Neurological: She is alert and oriented to person, place, and time.  Skin: Skin is warm and dry.    MAU Course  Procedures  MDM -FHR is 151 -No complaints of abdominal pain or headache while in MAU; patient with many questions about COVID and Vitamin C in pregnancy.   Assessment and Plan   1. Presence of fetal heart sounds in second trimester    2. Long discussion with  patient about fetal movements and when to start kick counts (not until 28 weeks). Emphasized importance of getting COVID vaccine in the future. Reassured her of the appropriate amount of Vitamin C daily (2000 mg)  And low risk of miscarriage if she takes Vitamin C.  3. Patient reassured of fetal heart tones and provider discussion; she will keep follow up appt at [place] next week.   Mervyn Skeeters Vadie Principato 05/26/2019, 9:46 AM

## 2019-05-26 NOTE — MAU Note (Signed)
Pt presents to MAU with c/o DFM x 1 week and she has been having intermittent headaches x 2 weeks. She is expressing concerns with pre-eclampsia today. She is also reporting upper abdominal pain that comes and goes x 1 week. Pt denies VB and LOF.

## 2019-05-26 NOTE — Discharge Instructions (Signed)

## 2019-06-03 ENCOUNTER — Other Ambulatory Visit: Payer: Self-pay | Admitting: Obstetrics and Gynecology

## 2019-06-03 DIAGNOSIS — Z3A22 22 weeks gestation of pregnancy: Secondary | ICD-10-CM

## 2019-06-03 DIAGNOSIS — Z3689 Encounter for other specified antenatal screening: Secondary | ICD-10-CM

## 2019-06-28 ENCOUNTER — Ambulatory Visit: Payer: Medicaid Other | Attending: Obstetrics and Gynecology

## 2019-06-28 ENCOUNTER — Ambulatory Visit: Payer: Medicaid Other

## 2019-06-28 ENCOUNTER — Other Ambulatory Visit: Payer: Self-pay

## 2019-06-28 ENCOUNTER — Other Ambulatory Visit: Payer: Medicaid Other

## 2019-06-28 DIAGNOSIS — Z3689 Encounter for other specified antenatal screening: Secondary | ICD-10-CM | POA: Insufficient documentation

## 2019-06-28 DIAGNOSIS — E669 Obesity, unspecified: Secondary | ICD-10-CM

## 2019-06-28 DIAGNOSIS — Z3A23 23 weeks gestation of pregnancy: Secondary | ICD-10-CM | POA: Insufficient documentation

## 2019-06-28 DIAGNOSIS — O98512 Other viral diseases complicating pregnancy, second trimester: Secondary | ICD-10-CM

## 2019-06-28 DIAGNOSIS — O99212 Obesity complicating pregnancy, second trimester: Secondary | ICD-10-CM

## 2019-06-28 DIAGNOSIS — O2692 Pregnancy related conditions, unspecified, second trimester: Secondary | ICD-10-CM | POA: Diagnosis not present

## 2019-06-28 DIAGNOSIS — O34219 Maternal care for unspecified type scar from previous cesarean delivery: Secondary | ICD-10-CM | POA: Diagnosis not present

## 2019-06-28 DIAGNOSIS — Z3A22 22 weeks gestation of pregnancy: Secondary | ICD-10-CM

## 2019-06-28 DIAGNOSIS — O35DXX0 Maternal care for other (suspected) fetal abnormality and damage, fetal gastrointestinal anomalies, not applicable or unspecified: Secondary | ICD-10-CM

## 2019-06-28 DIAGNOSIS — Z363 Encounter for antenatal screening for malformations: Secondary | ICD-10-CM | POA: Insufficient documentation

## 2019-06-28 DIAGNOSIS — B009 Herpesviral infection, unspecified: Secondary | ICD-10-CM

## 2019-06-29 ENCOUNTER — Other Ambulatory Visit: Payer: Self-pay | Admitting: Obstetrics and Gynecology

## 2019-06-29 DIAGNOSIS — Z3A22 22 weeks gestation of pregnancy: Secondary | ICD-10-CM

## 2019-06-29 DIAGNOSIS — Z3689 Encounter for other specified antenatal screening: Secondary | ICD-10-CM

## 2019-06-29 LAB — TOXOPLASMA GONDII ANTIBODY, IGG: Toxoplasma IgG Ratio: <3 IU/mL (ref 0.0–7.1)

## 2019-06-29 LAB — CMV IGM: CMV IgM Ser EIA-aCnc: <30 AU/mL (ref 0.0–29.9)

## 2019-06-29 LAB — CMV ANTIBODY, IGG (EIA): CMV Ab - IgG: <0.6 U/mL (ref 0.00–0.59)

## 2019-06-29 LAB — INFECT DISEASE AB IGM REFLEX 1

## 2019-06-29 LAB — TOXOPLASMA GONDII ANTIBODY, IGM: Toxoplasma Antibody- IgM: <3 AU/mL (ref 0.0–7.9)

## 2019-06-30 ENCOUNTER — Other Ambulatory Visit: Payer: Self-pay | Admitting: *Deleted

## 2019-06-30 DIAGNOSIS — Z362 Encounter for other antenatal screening follow-up: Secondary | ICD-10-CM

## 2019-07-08 ENCOUNTER — Telehealth: Payer: Self-pay | Admitting: Genetic Counselor

## 2019-07-08 NOTE — Telephone Encounter (Signed)
I called Rhonda Barron to notify her of her negative CMV and toxoplasmosis testing. An echogenic bowel was noted on Rhonda Barron's ultrasound on 6/15, so testing for prenatal infections was ordered at that time. This negative result suggests that the fetal echogenic bowel is likely not due to a prenatal infection. We reviewed other possible explanations for the fetal echogenic bowel, including chromosomal aneuploidies, cystic fibrosis, fetal blood ingestion, and normal variation. Rhonda Barron reportedly had screening for chromosomal aneuploidies which was low risk earlier in her pregnancy. She also informed me that she believes she had testing for cystic fibrosis carrier status at her last OBGYN visit.   Rhonda Barron inquired about the next steps now that this testing was negative. We discussed that she will continue to be followed closely by ultrasounds. Echogenic bowel can resolve in some cases. If no explanation is found prenatally or postnatally for the fetus's echogenic bowel, it was likely a normal variant. Rhonda Barron confirmed that she had no further questions at this time.  Buelah Manis, MS, [facility] Genetic Counselor

## 2019-07-26 ENCOUNTER — Ambulatory Visit: Payer: Medicaid Other

## 2019-08-03 ENCOUNTER — Ambulatory Visit: Payer: Medicaid Other | Attending: Obstetrics and Gynecology

## 2019-08-03 ENCOUNTER — Other Ambulatory Visit: Payer: Self-pay

## 2019-08-03 ENCOUNTER — Ambulatory Visit: Payer: Medicaid Other | Admitting: *Deleted

## 2019-08-03 ENCOUNTER — Other Ambulatory Visit: Payer: Self-pay | Admitting: *Deleted

## 2019-08-03 VITALS — BP 138/75 | HR 84

## 2019-08-03 DIAGNOSIS — O99210 Obesity complicating pregnancy, unspecified trimester: Secondary | ICD-10-CM

## 2019-08-03 DIAGNOSIS — Z362 Encounter for other antenatal screening follow-up: Secondary | ICD-10-CM | POA: Diagnosis not present

## 2019-08-03 DIAGNOSIS — O34219 Maternal care for unspecified type scar from previous cesarean delivery: Secondary | ICD-10-CM | POA: Diagnosis not present

## 2019-08-03 DIAGNOSIS — O358XX0 Maternal care for other (suspected) fetal abnormality and damage, not applicable or unspecified: Secondary | ICD-10-CM | POA: Diagnosis not present

## 2019-08-03 DIAGNOSIS — O283 Abnormal ultrasonic finding on antenatal screening of mother: Secondary | ICD-10-CM

## 2019-08-03 DIAGNOSIS — O99213 Obesity complicating pregnancy, third trimester: Secondary | ICD-10-CM

## 2019-08-03 DIAGNOSIS — O98513 Other viral diseases complicating pregnancy, third trimester: Secondary | ICD-10-CM

## 2019-08-03 DIAGNOSIS — E669 Obesity, unspecified: Secondary | ICD-10-CM

## 2019-08-03 DIAGNOSIS — O2693 Pregnancy related conditions, unspecified, third trimester: Secondary | ICD-10-CM

## 2019-08-03 DIAGNOSIS — Z363 Encounter for antenatal screening for malformations: Secondary | ICD-10-CM

## 2019-08-03 DIAGNOSIS — Z3A28 28 weeks gestation of pregnancy: Secondary | ICD-10-CM

## 2019-08-03 DIAGNOSIS — B009 Herpesviral infection, unspecified: Secondary | ICD-10-CM

## 2019-08-04 NOTE — Progress Notes (Signed)
k

## 2019-08-31 ENCOUNTER — Ambulatory Visit: Payer: Medicaid Other | Admitting: *Deleted

## 2019-08-31 ENCOUNTER — Other Ambulatory Visit: Payer: Self-pay | Admitting: *Deleted

## 2019-08-31 ENCOUNTER — Other Ambulatory Visit: Payer: Self-pay

## 2019-08-31 ENCOUNTER — Ambulatory Visit: Payer: Medicaid Other | Attending: Obstetrics and Gynecology

## 2019-08-31 VITALS — BP 133/76 | HR 91

## 2019-08-31 DIAGNOSIS — O358XX0 Maternal care for other (suspected) fetal abnormality and damage, not applicable or unspecified: Secondary | ICD-10-CM

## 2019-08-31 DIAGNOSIS — O2693 Pregnancy related conditions, unspecified, third trimester: Secondary | ICD-10-CM

## 2019-08-31 DIAGNOSIS — Z362 Encounter for other antenatal screening follow-up: Secondary | ICD-10-CM

## 2019-08-31 DIAGNOSIS — O34219 Maternal care for unspecified type scar from previous cesarean delivery: Secondary | ICD-10-CM | POA: Diagnosis not present

## 2019-08-31 DIAGNOSIS — E669 Obesity, unspecified: Secondary | ICD-10-CM

## 2019-08-31 DIAGNOSIS — Z363 Encounter for antenatal screening for malformations: Secondary | ICD-10-CM | POA: Diagnosis not present

## 2019-08-31 DIAGNOSIS — O283 Abnormal ultrasonic finding on antenatal screening of mother: Secondary | ICD-10-CM | POA: Diagnosis not present

## 2019-08-31 DIAGNOSIS — Z3A32 32 weeks gestation of pregnancy: Secondary | ICD-10-CM

## 2019-08-31 DIAGNOSIS — B009 Herpesviral infection, unspecified: Secondary | ICD-10-CM

## 2019-08-31 DIAGNOSIS — O98513 Other viral diseases complicating pregnancy, third trimester: Secondary | ICD-10-CM

## 2019-08-31 DIAGNOSIS — O99213 Obesity complicating pregnancy, third trimester: Secondary | ICD-10-CM

## 2019-09-07 ENCOUNTER — Other Ambulatory Visit: Payer: Self-pay | Admitting: Obstetrics & Gynecology

## 2019-09-21 ENCOUNTER — Ambulatory Visit: Payer: Medicaid Other

## 2019-10-06 LAB — OB RESULTS CONSOLE GBS: GBS: NEGATIVE

## 2019-10-10 NOTE — Patient Instructions (Signed)
Rhonda Barron  10/10/2019   Your procedure is scheduled on:  10/16/2019  Arrive at [address] at [facility] [address] on [address] at [facility]  and [address]. You are invited to use the FREE valet parking or use the Visitor's parking deck.  Pick up the phone at the desk and dial [phone].  Call this number if you have problems the morning of surgery: [phone]  Remember:   Do not eat food:(After Midnight) Desps de medianoche.  Do not drink clear liquids: (After Midnight) Desps de medianoche.  Take these medicines the morning of surgery with A SIP OF WATER:  Take valtrex as prescribed   Do not wear jewelry, make-up or nail polish.  Do not wear lotions, powders, or perfumes. Do not wear deodorant.  Do not shave 48 hours prior to surgery.  Do not bring valuables to the hospital.  [facility] is not   responsible for any belongings or valuables brought to the hospital.  Contacts, dentures or bridgework may not be worn into surgery.  Leave suitcase in the car. After surgery it may be brought to your room.  For patients admitted to the hospital, checkout time is 11:00 AM the day of              discharge.      Please read over the following fact sheets that you were given:     Preparing for Surgery

## 2019-10-11 ENCOUNTER — Encounter (HOSPITAL_COMMUNITY): Payer: Self-pay

## 2019-10-14 ENCOUNTER — Other Ambulatory Visit: Payer: Self-pay

## 2019-10-14 ENCOUNTER — Other Ambulatory Visit (HOSPITAL_COMMUNITY)
Admission: RE | Admit: 2019-10-14 | Discharge: 2019-10-14 | Disposition: A | Discharge status: Home / Self Care | Payer: Medicaid Other | Source: Ambulatory Visit | Attending: Obstetrics & Gynecology | Admitting: Obstetrics & Gynecology

## 2019-10-14 DIAGNOSIS — Z20822 Contact with and (suspected) exposure to covid-19: Secondary | ICD-10-CM | POA: Diagnosis present

## 2019-10-14 HISTORY — DX: Unspecified abnormal cytological findings in specimens from vagina: R87.629

## 2019-10-14 HISTORY — DX: Anemia, unspecified: D64.9

## 2019-10-14 LAB — CBC
HCT: 37.9 % (ref 36.0–46.0)
Hemoglobin: 12.8 g/dL (ref 12.0–15.0)
MCH: 30.2 pg (ref 26.0–34.0)
MCHC: 33.8 g/dL (ref 30.0–36.0)
MCV: 89.4 fL (ref 80.0–100.0)
Platelets: 253 10*3/uL (ref 150–400)
RBC: 4.24 MIL/uL (ref 3.87–5.11)
RDW: 13.3 % (ref 11.5–15.5)
WBC: 6.9 10*3/uL (ref 4.0–10.5)
nRBC: 0 % (ref 0.0–0.2)

## 2019-10-14 LAB — SARS CORONAVIRUS 2 (TAT 6-24 HRS): SARS Coronavirus 2: NEGATIVE

## 2019-10-14 LAB — TYPE AND SCREEN
ABO/RH(D): A POS
Antibody Screen: NEGATIVE

## 2019-10-14 LAB — RPR: RPR Ser Ql: NONREACTIVE

## 2019-10-14 NOTE — MAU Note (Signed)
Asymptomatic, swab collected. Waiting on lab

## 2019-10-15 ENCOUNTER — Other Ambulatory Visit: Payer: Self-pay | Admitting: Obstetrics & Gynecology

## 2019-10-15 NOTE — H&P (Addendum)
Rhonda Barron is a 34 y.o. female, G3P2002, IUP at 15 weeks, presenting for Elective RCS with Dr Alwyn Pea on 10/3. Pt endorse + Fm. Denies vaginal leakage. Denies vaginal bleeding. Denies feeling cxt's. 9/7 growth 5.7lbs, vertex, posterior placenta.   Pregnancy Problems allergy to latex fetal ultrasound scan abnormal (echogenetic bowel noted on [place] at 23, resolved last [place], neg CMV panel, low risk female) genital herpes simplex (valtrex from 36 weeks) history of cesarean section (2016 for hsv) maternal obesity complicating pregnancy, childbirth and the  rubella non-immune (offer vaccine PP)  Medications cephalexin Prenatal Vitamin valacyclovir  Patient Active Problem List   Diagnosis Date Noted  . History of cesarean delivery affecting pregnancy 10/16/2019  . H/O cesarean section 10/16/2019  . Morbid obesity ([place])   . 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 Inguinal hernia - pediatric w/ laparscopic exam 05/15/2014  . Obese 12/23/2010  . History of abnormal Pap smear 12/23/2010     Medications Prior to Admission  Medication Sig Dispense Refill Last Dose  . acetaminophen (TYLENOL) 500 MG tablet Take 500 mg by mouth every 6 (six) hours as needed for mild pain.      . Prenatal Vit-Fe Fumarate-FA (PRENATAL VITAMINS PO) Take 1 tablet by mouth daily.      . valACYclovir (VALTREX) 1000 MG tablet Take 1,000 mg by mouth daily.     Marland Kitchen guaiFENesin (ROBITUSSIN) 100 MG/5ML liquid Take 10 mLs (200 mg total) by mouth 3 (three) times daily as needed for cough. (Patient not taking: Reported on 05/26/2019) 120 mL 0 Completed Course at Unknown time    Past Medical History:  Diagnosis Date  . Abnormal Pap smear   . Anemia   . History of chlamydia   . Morbid obesity ([place])   . Vaginal Pap smear, abnormal      No current facility-administered medications on file prior to encounter.   Current Outpatient Medications on File Prior to Encounter  Medication Sig  Dispense Refill  . acetaminophen (TYLENOL) 500 MG tablet Take 500 mg by mouth every 6 (six) hours as needed for mild pain.     . Prenatal Vit-Fe Fumarate-FA (PRENATAL VITAMINS PO) Take 1 tablet by mouth daily.     . valACYclovir (VALTREX) 1000 MG tablet Take 1,000 mg by mouth daily.    Marland Kitchen guaiFENesin (ROBITUSSIN) 100 MG/5ML liquid Take 10 mLs (200 mg total) by mouth 3 (three) times daily as needed for cough. (Patient not taking: Reported on 05/26/2019) 120 mL 0     Allergies  Allergen Reactions  . Latex Itching    History of present pregnancy: Pt Info/Preference:  Screening/Consents:  Labs:   EDD: Estimated Date of Delivery: 10/23/19  Establised: Patient's last menstrual period was 01/15/2019.  Anatomy Scan: Date: 08/31/2019 Placenta Location: posterior Genetic Screen: Panoroma:low risk female AFP:  First Tri: Quad:  Office: [facility]            Md: dr Alwyn Pea First PNV: 12.4 weeks Blood Type --/--/A POS (10/01 7824)  Language: english Last PNV: 38.4 weeks Rhogam    Flu Vaccine:  declined   Antibody NEG (10/01 0837)  TDaP vaccine utd   GTT: Early: 5.1 hga1c Third Trimester: 109  Feeding Plan: Breast/bottle BTL: no Rubella: Nonimmune (04/01 0000)  Contraception: ??? VBAC: no RPR: NON REACTIVE (10/01 0831)   Circumcision: In pt desired   HBsAg: Negative (04/01 0000)  Pediatrician:  ???   HIV: Non-reactive (04/01 0000)   Prenatal Classes: no  Additional [place]: 9/7 growth 5.7lbs GBS: Negative/-- (09/23 0000)(For PCN allergy, check sensitivities)       Chlamydia: neg    MFM Referral/Consult:  GC: neg  Support Person: parnter   PAP: 2020-normal  Pain Management: spinal Neonatologist Referral:  Hgb Electrophoresis:  AA  Birth Plan: RCS   Hgb NOB: 13.5    28W: 12.4  9/7 Growth:  OB History    Gravida  4   Para  2   Term  2   Preterm  0   AB  1   Living  2     SAB  0   TAB  1   Ectopic  0   Multiple  0   Live Births  2          Past Medical History:  Diagnosis Date  .  Abnormal Pap smear   . Anemia   . History of chlamydia   . Morbid obesity ([place])   . Vaginal Pap smear, abnormal    Past Surgical History:  Procedure Laterality Date  . CESAREAN SECTION N/A 01/05/2015   Procedure: CESAREAN SECTION;  Surgeon: Ena Dawley, MD;  Location: [facility];  Service: Obstetrics;  Laterality: N/A;  . INGUINAL HERNIA PEDIATRIC WITH LAPAROSCOPIC EXAM    . NO PAST SURGERIES     Family History: family history includes Asthma in her brother; Diabetes in her father; Healthy in her mother; Hypertension in her father; Stroke in her paternal uncle. Social History:  reports that she has never smoked. She has never used smokeless tobacco. She reports current alcohol use. She reports that she does not use drugs.   Prenatal Transfer Tool  Maternal Diabetes: No Genetic Screening: Normal Maternal Ultrasounds/Referrals: Echogenic bowel and Other: That has resolved Fetal Ultrasounds or other Referrals:  Referred to Materal Fetal Medicine  Maternal Substance Abuse:  No Significant Maternal Medications:  Meds include: Other: Valtrex Significant Maternal Lab Results: Group B Strep negative and Other: HSV+.  ROS:  Review of Systems  Constitutional: Negative.   HENT: Negative.   Eyes: Negative.   Respiratory: Negative.   Cardiovascular: Negative.   Gastrointestinal: Negative.   Genitourinary: Negative.   Musculoskeletal: Negative.   Skin: Negative.      Physical Exam: BP (!) 138/99   Pulse 75   Temp 98.9 F (37.2 C) (Oral)   Resp 16   Ht 5\' 7"  (1.702 m)   Wt 133.4 kg   LMP 01/15/2019   BMI 46.05 kg/m  BP 120/60, HR 74, RR 18, Temp 98.4 po Physical Exam Vitals and nursing note reviewed. Exam conducted with a chaperone present.  HENT:     Head: Normocephalic and atraumatic.     Mouth/Throat:     Mouth: Mucous membranes are moist.  Eyes:     Pupils: Pupils are equal, round, and reactive to light.  Cardiovascular:     Rate and Rhythm: Normal rate and regular  rhythm.  Pulmonary:     Effort: Pulmonary effort is normal.     Breath sounds: Normal breath sounds.  Abdominal:     General: Abdomen is flat.     Palpations: Abdomen is soft.  Genitourinary:    Comments: Deferred  Musculoskeletal:        General: Normal range of motion.  Skin:    General: Skin is warm.     Capillary Refill: Capillary refill takes less than 2 seconds.  Neurological:     General: No focal deficit present.     Mental Status:  She is alert.  Psychiatric:        Mood and Affect: Mood normal.      FHT: 130 UC:   none SVE:  Deffered  , vertex verified by fetal sutures.  Leopold's: Position vertex, EFW 7lbs via leopold's.   Labs: No results found for this or any previous visit (from the past 24 hour(s)).  Imaging:  No results found.  MAU Course: Orders Placed This Encounter  Procedures  . Diet NPO time specified Except for: Sips with Meds  . Diet NPO time specified  . Informed Consent Details: Physician/Practitioner Attestation; Transcribe to consent form and obtain patient signature  . Initiate Pre-op Protocol  . Pre-admission testing diagnosis  . Anesthesia per Enhanced Recovery Pathway  . Clip operative site  . Initiate Pre-op Protocol  . Clip operative site  . SCD's  . Informed Consent Details: Physician/Practitioner Attestation; Transcribe to consent form and obtain patient signature  . Lab per anesthesia  . Foot Pump  . Verify informed consent  . Place and maintain sequential compression device  . Instruct patient regarding incentive spirometry  . Instruct patient in turn, cough, deep breathe, and leg exercises  . Full code  . Admit to Inpatient (patient's expected length of stay will be greater than 2 midnights or inpatient only procedure)  . Admit to Inpatient (patient's expected length of stay will be greater than 2 midnights or inpatient only procedure)   Meds ordered this encounter  Medications  . povidone-iodine 10 % swab 2 application   . sodium citrate-citric acid (ORACIT) solution 30 mL  . lactated ringers infusion  . ceFAZolin (ANCEF) 3 g in dextrose 5 % 50 mL IVPB    Order Specific Question:   Indication:    Answer:   Surgical Prophylaxis  . acetaminophen (TYLENOL) tablet 1,000 mg  . sodium citrate-citric acid (ORACIT) solution 30 mL    Assessment/Plan: Mirranda A Mensinger is a 34 y.o. female, G3P2002, IUP at 11 weeks, presenting for Elective RCS with Dr Alwyn Pea on 10/3. Pt endorse + Fm. Denies vaginal leakage. Denies vaginal bleeding. Denies feeling cxt's. 9/7 growth 5.7lbs, vertex, posterior placenta.   Pregnancy Problems allergy to latex fetal ultrasound scan abnormal (echogenetic bowel noted on [place] at 23, resolved last [place], neg CMV panel, low risk female) genital herpes simplex (valtrex from 36 weeks) history of cesarean section (2016 for hsv) maternal obesity complicating pregnancy, childbirth and the  rubella non-immune (offer vaccine PP)  Medications cephalexin Prenatal Vitamin valacyclovir  Plan: Admit to Pre-op per consult with DR Alwyn Pea Routine preop [facility] orders Pain med/Spinal Bicitra Ancef SCD Clip site [place] NP-C, CNM, MSN 10/16/2019, 8:32 AM

## 2019-10-16 ENCOUNTER — Other Ambulatory Visit: Payer: Self-pay

## 2019-10-16 ENCOUNTER — Inpatient Hospital Stay (HOSPITAL_COMMUNITY): Payer: Medicaid Other | Admitting: Anesthesiology

## 2019-10-16 ENCOUNTER — Encounter (HOSPITAL_COMMUNITY)
Admission: RE | Disposition: A | Discharge status: Home / Self Care | Payer: Self-pay | Source: Home / Self Care | Attending: Obstetrics & Gynecology

## 2019-10-16 ENCOUNTER — Encounter (HOSPITAL_COMMUNITY): Payer: Self-pay

## 2019-10-16 ENCOUNTER — Inpatient Hospital Stay (HOSPITAL_COMMUNITY): Admit: 2019-10-16 | Payer: Medicaid Other | Admitting: Obstetrics & Gynecology

## 2019-10-16 ENCOUNTER — Inpatient Hospital Stay (HOSPITAL_COMMUNITY)
Admission: RE | Admit: 2019-10-16 | Discharge: 2019-10-18 | DRG: 787 | Disposition: A | Discharge status: Home / Self Care | Payer: Medicaid Other | Attending: Obstetrics & Gynecology | Admitting: Obstetrics & Gynecology

## 2019-10-16 ENCOUNTER — Encounter (HOSPITAL_COMMUNITY): Payer: Self-pay | Admitting: Obstetrics & Gynecology

## 2019-10-16 DIAGNOSIS — A6000 Herpesviral infection of urogenital system, unspecified: Secondary | ICD-10-CM | POA: Diagnosis present

## 2019-10-16 DIAGNOSIS — O9832 Other infections with a predominantly sexual mode of transmission complicating childbirth: Secondary | ICD-10-CM | POA: Diagnosis present

## 2019-10-16 DIAGNOSIS — D1723 Benign lipomatous neoplasm of skin and subcutaneous tissue of right leg: Secondary | ICD-10-CM | POA: Diagnosis present

## 2019-10-16 DIAGNOSIS — Z3A39 39 weeks gestation of pregnancy: Secondary | ICD-10-CM | POA: Diagnosis not present

## 2019-10-16 DIAGNOSIS — Z91040 Latex allergy status: Secondary | ICD-10-CM | POA: Diagnosis not present

## 2019-10-16 DIAGNOSIS — O34219 Maternal care for unspecified type scar from previous cesarean delivery: Secondary | ICD-10-CM | POA: Diagnosis present

## 2019-10-16 DIAGNOSIS — O34211 Maternal care for low transverse scar from previous cesarean delivery: Secondary | ICD-10-CM | POA: Diagnosis present

## 2019-10-16 DIAGNOSIS — O99214 Obesity complicating childbirth: Secondary | ICD-10-CM | POA: Diagnosis present

## 2019-10-16 DIAGNOSIS — Z98891 History of uterine scar from previous surgery: Secondary | ICD-10-CM

## 2019-10-16 DIAGNOSIS — O9972 Diseases of the skin and subcutaneous tissue complicating childbirth: Secondary | ICD-10-CM | POA: Diagnosis present

## 2019-10-16 DIAGNOSIS — B009 Herpesviral infection, unspecified: Secondary | ICD-10-CM | POA: Diagnosis present

## 2019-10-16 DIAGNOSIS — O99892 Other specified diseases and conditions complicating childbirth: Secondary | ICD-10-CM

## 2019-10-16 SURGERY — Surgical Case
Anesthesia: Regional

## 2019-10-16 SURGERY — Surgical Case
Anesthesia: Spinal

## 2019-10-16 MED ORDER — NALBUPHINE HCL 10 MG/ML IJ SOLN: 5.0000 mg | INTRAMUSCULAR | Status: DC | PRN

## 2019-10-16 MED ORDER — DEXTROSE 5 % IV SOLN
3.0000 g | INTRAVENOUS | Status: AC
  Administered 2019-10-16: 3 g via INTRAVENOUS

## 2019-10-16 MED ORDER — ONDANSETRON HCL 4 MG/2ML IJ SOLN
INTRAMUSCULAR | Status: AC
  Filled 2019-10-16: qty 2

## 2019-10-16 MED ORDER — POVIDONE-IODINE 10 % EX SWAB
2.0000 "application " | Freq: Once | CUTANEOUS | Status: AC
  Administered 2019-10-16: 2 via TOPICAL

## 2019-10-16 MED ORDER — NALOXONE HCL 4 MG/10ML IJ SOLN
1.0000 ug/kg/h | INTRAVENOUS | Status: DC | PRN
  Filled 2019-10-16: qty 5

## 2019-10-16 MED ORDER — PHENYLEPHRINE HCL-NACL 20-0.9 MG/250ML-% IV SOLN
INTRAVENOUS | Status: AC
  Filled 2019-10-16: qty 250

## 2019-10-16 MED ORDER — TETANUS-DIPHTH-ACELL PERTUSSIS 5-2.5-18.5 LF-MCG/0.5 IM SUSP: 0.5000 mL | Freq: Once | INTRAMUSCULAR | Status: DC

## 2019-10-16 MED ORDER — SIMETHICONE 80 MG PO CHEW
80.0000 mg | CHEWABLE_TABLET | Freq: Three times a day (TID) | ORAL | Status: DC
  Administered 2019-10-17 – 2019-10-18 (×4): 80 mg via ORAL
  Filled 2019-10-16 (×3): qty 1

## 2019-10-16 MED ORDER — MENTHOL 3 MG MT LOZG: 1.0000 | LOZENGE | OROMUCOSAL | Status: DC | PRN

## 2019-10-16 MED ORDER — OXYTOCIN-SODIUM CHLORIDE 30-0.9 UT/500ML-% IV SOLN
INTRAVENOUS | Status: AC
  Filled 2019-10-16: qty 500

## 2019-10-16 MED ORDER — SOD CITRATE-CITRIC ACID 500-334 MG/5ML PO SOLN: 30.0000 mL | ORAL | Status: DC

## 2019-10-16 MED ORDER — OXYTOCIN-SODIUM CHLORIDE 30-0.9 UT/500ML-% IV SOLN
INTRAVENOUS | Status: DC | PRN
  Administered 2019-10-16: 30 [IU] via INTRAVENOUS

## 2019-10-16 MED ORDER — METHYLERGONOVINE MALEATE 0.2 MG/ML IJ SOLN
INTRAMUSCULAR | Status: DC | PRN
  Administered 2019-10-16: .2 mg via INTRAMUSCULAR

## 2019-10-16 MED ORDER — HYDROMORPHONE HCL 1 MG/ML IJ SOLN: 0.2500 mg | INTRAMUSCULAR | Status: DC | PRN

## 2019-10-16 MED ORDER — NALOXONE HCL 0.4 MG/ML IJ SOLN: 0.4000 mg | INTRAMUSCULAR | Status: DC | PRN

## 2019-10-16 MED ORDER — FERROUS SULFATE 325 (65 FE) MG PO TABS
325.0000 mg | ORAL_TABLET | Freq: Two times a day (BID) | ORAL | Status: DC
  Administered 2019-10-17 – 2019-10-18 (×3): 325 mg via ORAL
  Filled 2019-10-16 (×3): qty 1

## 2019-10-16 MED ORDER — ACETAMINOPHEN 500 MG PO TABS
1000.0000 mg | ORAL_TABLET | ORAL | Status: AC
  Administered 2019-10-16: 1000 mg via ORAL

## 2019-10-16 MED ORDER — BUPIVACAINE HCL (PF) 0.5 % IJ SOLN
INTRAMUSCULAR | Status: AC
  Filled 2019-10-16: qty 30

## 2019-10-16 MED ORDER — PHENYLEPHRINE HCL-NACL 20-0.9 MG/250ML-% IV SOLN
INTRAVENOUS | Status: DC | PRN
  Administered 2019-10-16: 30 ug/min via INTRAVENOUS

## 2019-10-16 MED ORDER — ACETAMINOPHEN 500 MG PO TABS
ORAL_TABLET | ORAL | Status: AC
  Filled 2019-10-16: qty 2

## 2019-10-16 MED ORDER — FENTANYL CITRATE (PF) 100 MCG/2ML IJ SOLN
INTRAMUSCULAR | Status: DC | PRN
Start: 2019-10-16 — End: 2019-10-16
  Administered 2019-10-16: 15 ug via INTRATHECAL

## 2019-10-16 MED ORDER — SCOPOLAMINE 1 MG/3DAYS TD PT72: 1.0000 | MEDICATED_PATCH | Freq: Once | TRANSDERMAL | Status: DC

## 2019-10-16 MED ORDER — MORPHINE SULFATE (PF) 0.5 MG/ML IJ SOLN
INTRAMUSCULAR | Status: AC
  Filled 2019-10-16: qty 10

## 2019-10-16 MED ORDER — SCOPOLAMINE 1 MG/3DAYS TD PT72
MEDICATED_PATCH | TRANSDERMAL | Status: AC
  Filled 2019-10-16: qty 1

## 2019-10-16 MED ORDER — PROMETHAZINE HCL 25 MG/ML IJ SOLN: 6.2500 mg | INTRAMUSCULAR | Status: DC | PRN

## 2019-10-16 MED ORDER — FENTANYL CITRATE (PF) 100 MCG/2ML IJ SOLN
INTRAMUSCULAR | Status: AC
  Filled 2019-10-16: qty 2

## 2019-10-16 MED ORDER — LACTATED RINGERS IV SOLN: INTRAVENOUS | Status: DC

## 2019-10-16 MED ORDER — BUPIVACAINE HCL 0.5 % IJ SOLN
INTRAMUSCULAR | Status: DC | PRN
  Administered 2019-10-16: 20 mL

## 2019-10-16 MED ORDER — COCONUT OIL OIL: 1.0000 "application " | TOPICAL_OIL | Status: DC | PRN

## 2019-10-16 MED ORDER — NALBUPHINE HCL 10 MG/ML IJ SOLN: 5.0000 mg | Freq: Once | INTRAMUSCULAR | Status: DC | PRN

## 2019-10-16 MED ORDER — METHYLERGONOVINE MALEATE 0.2 MG/ML IJ SOLN
INTRAMUSCULAR | Status: AC
  Filled 2019-10-16: qty 1

## 2019-10-16 MED ORDER — LACTATED RINGERS IV SOLN: INTRAVENOUS | Status: DC | PRN

## 2019-10-16 MED ORDER — SODIUM CHLORIDE 0.9 % IV SOLN: INTRAVENOUS | Status: DC | PRN

## 2019-10-16 MED ORDER — STERILE WATER FOR IRRIGATION IR SOLN
Status: DC | PRN
  Administered 2019-10-16: 1

## 2019-10-16 MED ORDER — DIPHENHYDRAMINE HCL 50 MG/ML IJ SOLN
INTRAMUSCULAR | Status: AC
  Filled 2019-10-16: qty 1

## 2019-10-16 MED ORDER — OXYCODONE HCL 5 MG PO TABS: 5.0000 mg | ORAL_TABLET | Freq: Once | ORAL | Status: DC | PRN

## 2019-10-16 MED ORDER — DIPHENHYDRAMINE HCL 25 MG PO CAPS: 25.0000 mg | ORAL_CAPSULE | Freq: Four times a day (QID) | ORAL | Status: DC | PRN

## 2019-10-16 MED ORDER — DIPHENHYDRAMINE HCL 25 MG PO CAPS: 25.0000 mg | ORAL_CAPSULE | ORAL | Status: DC | PRN

## 2019-10-16 MED ORDER — SENNOSIDES-DOCUSATE SODIUM 8.6-50 MG PO TABS
2.0000 | ORAL_TABLET | ORAL | Status: DC
  Administered 2019-10-16 – 2019-10-17 (×2): 2 via ORAL
  Filled 2019-10-16 (×2): qty 2

## 2019-10-16 MED ORDER — KETOROLAC TROMETHAMINE 30 MG/ML IJ SOLN: 30.0000 mg | Freq: Once | INTRAMUSCULAR | Status: DC

## 2019-10-16 MED ORDER — KETOROLAC TROMETHAMINE 30 MG/ML IJ SOLN
30.0000 mg | Freq: Four times a day (QID) | INTRAMUSCULAR | Status: AC
  Administered 2019-10-16 – 2019-10-17 (×3): 30 mg via INTRAVENOUS
  Filled 2019-10-16 (×3): qty 1

## 2019-10-16 MED ORDER — LACTATED RINGERS IV SOLN
INTRAVENOUS | Status: DC
  Administered 2019-10-16: 100 mL/h via INTRAVENOUS

## 2019-10-16 MED ORDER — MORPHINE SULFATE (PF) 0.5 MG/ML IJ SOLN
INTRAMUSCULAR | Status: DC | PRN
Start: 2019-10-16 — End: 2019-10-16
  Administered 2019-10-16: 150 ug via INTRATHECAL

## 2019-10-16 MED ORDER — ACETAMINOPHEN 500 MG PO TABS
1000.0000 mg | ORAL_TABLET | Freq: Four times a day (QID) | ORAL | Status: DC
  Administered 2019-10-16 – 2019-10-18 (×8): 1000 mg via ORAL
  Filled 2019-10-16 (×8): qty 2

## 2019-10-16 MED ORDER — DIPHENHYDRAMINE HCL 50 MG/ML IJ SOLN
INTRAMUSCULAR | Status: DC | PRN
  Administered 2019-10-16: 12.5 mg via INTRAVENOUS

## 2019-10-16 MED ORDER — BUPIVACAINE IN DEXTROSE 0.75-8.25 % IT SOLN
INTRATHECAL | Status: DC | PRN
  Administered 2019-10-16: 1.6 mL via INTRATHECAL

## 2019-10-16 MED ORDER — OXYTOCIN-SODIUM CHLORIDE 30-0.9 UT/500ML-% IV SOLN: 2.5000 [IU]/h | INTRAVENOUS | Status: AC

## 2019-10-16 MED ORDER — PRENATAL MULTIVITAMIN CH
1.0000 | ORAL_TABLET | Freq: Every day | ORAL | Status: DC
  Administered 2019-10-17 – 2019-10-18 (×2): 1 via ORAL
  Filled 2019-10-16 (×2): qty 1

## 2019-10-16 MED ORDER — IBUPROFEN 800 MG PO TABS
800.0000 mg | ORAL_TABLET | Freq: Four times a day (QID) | ORAL | Status: DC
  Administered 2019-10-17 – 2019-10-18 (×5): 800 mg via ORAL
  Filled 2019-10-16 (×5): qty 1

## 2019-10-16 MED ORDER — OXYCODONE HCL 5 MG PO TABS: 5.0000 mg | ORAL_TABLET | ORAL | Status: DC | PRN

## 2019-10-16 MED ORDER — MEPERIDINE HCL 25 MG/ML IJ SOLN: 6.2500 mg | INTRAMUSCULAR | Status: DC | PRN

## 2019-10-16 MED ORDER — SOD CITRATE-CITRIC ACID 500-334 MG/5ML PO SOLN
ORAL | Status: AC
  Filled 2019-10-16: qty 30

## 2019-10-16 MED ORDER — SODIUM CHLORIDE 0.9% FLUSH: 3.0000 mL | INTRAVENOUS | Status: DC | PRN

## 2019-10-16 MED ORDER — SODIUM CHLORIDE 0.9 % IR SOLN
Status: DC | PRN
  Administered 2019-10-16: 1

## 2019-10-16 MED ORDER — SIMETHICONE 80 MG PO CHEW: 80.0000 mg | CHEWABLE_TABLET | ORAL | Status: DC | PRN

## 2019-10-16 MED ORDER — DEXTROSE 5 % IV SOLN
INTRAVENOUS | Status: AC
  Filled 2019-10-16: qty 3000

## 2019-10-16 MED ORDER — FENTANYL CITRATE (PF) 100 MCG/2ML IJ SOLN: 50.0000 ug | INTRAMUSCULAR | Status: DC | PRN

## 2019-10-16 MED ORDER — DIBUCAINE (PERIANAL) 1 % EX OINT: 1.0000 "application " | TOPICAL_OINTMENT | CUTANEOUS | Status: DC | PRN

## 2019-10-16 MED ORDER — KETOROLAC TROMETHAMINE 30 MG/ML IJ SOLN: 30.0000 mg | Freq: Once | INTRAMUSCULAR | Status: DC | PRN

## 2019-10-16 MED ORDER — WITCH HAZEL-GLYCERIN EX PADS: 1.0000 "application " | MEDICATED_PAD | CUTANEOUS | Status: DC | PRN

## 2019-10-16 MED ORDER — OXYCODONE HCL 5 MG/5ML PO SOLN: 5.0000 mg | Freq: Once | ORAL | Status: DC | PRN

## 2019-10-16 MED ORDER — SIMETHICONE 80 MG PO CHEW
80.0000 mg | CHEWABLE_TABLET | ORAL | Status: DC
  Administered 2019-10-16 – 2019-10-17 (×2): 80 mg via ORAL
  Filled 2019-10-16 (×2): qty 1

## 2019-10-16 MED ORDER — ONDANSETRON HCL 4 MG/2ML IJ SOLN
INTRAMUSCULAR | Status: DC | PRN
  Administered 2019-10-16: 4 mg via INTRAVENOUS

## 2019-10-16 MED ORDER — SOD CITRATE-CITRIC ACID 500-334 MG/5ML PO SOLN
30.0000 mL | ORAL | Status: AC
  Administered 2019-10-16: 30 mL via ORAL

## 2019-10-16 MED ORDER — DIPHENHYDRAMINE HCL 50 MG/ML IJ SOLN: 12.5000 mg | INTRAMUSCULAR | Status: DC | PRN

## 2019-10-16 MED ORDER — ZOLPIDEM TARTRATE 5 MG PO TABS: 5.0000 mg | ORAL_TABLET | Freq: Every evening | ORAL | Status: DC | PRN

## 2019-10-16 MED ORDER — ONDANSETRON HCL 4 MG/2ML IJ SOLN: 4.0000 mg | Freq: Three times a day (TID) | INTRAMUSCULAR | Status: DC | PRN

## 2019-10-16 SURGICAL SUPPLY — 46 items
APL SKNCLS STERI-STRIP NONHPOA (GAUZE/BANDAGES/DRESSINGS) ×1
BARRIER ADHS 3X4 INTERCEED (GAUZE/BANDAGES/DRESSINGS) ×1 IMPLANT
BENZOIN TINCTURE PRP APPL 2/3 (GAUZE/BANDAGES/DRESSINGS) ×2 IMPLANT
BLADE SURG 15 STRL LF C SS BP (BLADE) IMPLANT
BLADE SURG 15 STRL SS (BLADE) ×2
BRR ADH 4X3 ABS CNTRL BYND (GAUZE/BANDAGES/DRESSINGS) ×1
CHLORAPREP W/TINT 26ML (MISCELLANEOUS) ×2 IMPLANT
CLAMP CORD UMBIL (MISCELLANEOUS) IMPLANT
CLOSURE STERI STRIP 1/2 X4 (GAUZE/BANDAGES/DRESSINGS) ×1 IMPLANT
CLOTH BEACON ORANGE TIMEOUT ST (SAFETY) ×2 IMPLANT
DRSG OPSITE POSTOP 4X10 (GAUZE/BANDAGES/DRESSINGS) ×2 IMPLANT
ELECT REM PT RETURN 9FT ADLT (ELECTROSURGICAL) ×2
ELECTRODE REM PT RTRN 9FT ADLT (ELECTROSURGICAL) ×1 IMPLANT
EXTRACTOR VACUUM KIWI (MISCELLANEOUS) IMPLANT
GLOVE BIO SURGEON STRL SZ 6.5 (GLOVE) ×2 IMPLANT
GLOVE BIOGEL PI IND STRL 7.0 (GLOVE) ×2 IMPLANT
GLOVE BIOGEL PI INDICATOR 7.0 (GLOVE) ×2
GOWN STRL REUS W/TWL LRG LVL3 (GOWN DISPOSABLE) ×6 IMPLANT
KIT ABG SYR 3ML LUER SLIP (SYRINGE) IMPLANT
MAT PREVALON FULL STRYKER (MISCELLANEOUS) ×1 IMPLANT
NDL HYPO 25X5/8 SAFETYGLIDE (NEEDLE) IMPLANT
NEEDLE HYPO 22GX1.5 SAFETY (NEEDLE) IMPLANT
NEEDLE HYPO 25X5/8 SAFETYGLIDE (NEEDLE) IMPLANT
NS IRRIG 1000ML POUR BTL (IV SOLUTION) ×2 IMPLANT
PACK C SECTION WH (CUSTOM PROCEDURE TRAY) ×2 IMPLANT
PAD ABD 7.5X8 STRL (GAUZE/BANDAGES/DRESSINGS) ×2 IMPLANT
PAD OB MATERNITY 4.3X12.25 (PERSONAL CARE ITEMS) ×2 IMPLANT
PENCIL SMOKE EVAC W/HOLSTER (ELECTROSURGICAL) ×2 IMPLANT
RETAINER VISCERAL (MISCELLANEOUS) ×1 IMPLANT
RTRCTR C-SECT PINK 25CM LRG (MISCELLANEOUS) ×2 IMPLANT
SPONGE GAUZE 4X4 12PLY STER LF (GAUZE/BANDAGES/DRESSINGS) ×2 IMPLANT
STRIP CLOSURE SKIN 1/2X4 (GAUZE/BANDAGES/DRESSINGS) ×2 IMPLANT
SUT CHROMIC 2 0 CT 1 (SUTURE) ×4 IMPLANT
SUT MNCRL 0 VIOLET CTX 36 (SUTURE) ×2 IMPLANT
SUT MONOCRYL 0 CTX 36 (SUTURE) ×6
SUT PDS AB 0 CTX 60 (SUTURE) ×2 IMPLANT
SUT PLAIN 2 0 (SUTURE)
SUT PLAIN ABS 2-0 CT1 27XMFL (SUTURE) IMPLANT
SUT VIC AB 0 CTX 36 (SUTURE) ×4
SUT VIC AB 0 CTX36XBRD ANBCTRL (SUTURE) ×2 IMPLANT
SUT VIC AB 4-0 KS 27 (SUTURE) ×2 IMPLANT
SUT VICRYL 0 UR6 27IN ABS (SUTURE) ×1 IMPLANT
SYR CONTROL 10ML LL (SYRINGE) IMPLANT
TOWEL OR 17X24 6PK STRL BLUE (TOWEL DISPOSABLE) ×2 IMPLANT
TRAY FOLEY W/BAG SLVR 14FR LF (SET/KITS/TRAYS/PACK) ×2 IMPLANT
WATER STERILE IRR 1000ML POUR (IV SOLUTION) ×2 IMPLANT

## 2019-10-16 NOTE — Transfer of Care (Signed)
Immediate Anesthesia Transfer of Care Note  Patient: Rhonda Barron  Procedure(s) Performed: CESAREAN SECTION (N/A )  Patient Location: PACU  Anesthesia Type:Spinal  Level of Consciousness: awake and alert   Airway & Oxygen Therapy: Patient Spontanous Breathing  Post-op Assessment: Report given to RN  Post vital signs: Reviewed  Last Vitals:  Vitals Value Taken Time  BP 109/59 10/16/19 1138  Temp    Pulse 77 10/16/19 1142  Resp 16 10/16/19 1142  SpO2 100 % 10/16/19 1142  Vitals shown include unvalidated device data.  Last Pain:  Vitals:   10/16/19 0753  TempSrc: Oral         Complications: No complications documented.

## 2019-10-16 NOTE — Lactation Note (Signed)
This note was copied from a baby's chart. Lactation Consultation Note  Patient Name: Rhonda Barron Today's Date: 10/16/2019 Reason for consult: Initial assessment P3, 10 hour term female infant. Mom's hx: of herpes but no out break on breast, C/S delivery on Valtrex. Mom is experienced at breastfeeding, per mom, she BF her 1st child for 6 months and 2nd child for 3 months, mom does have DEBP at home and mom is active on the [place] in [place].  Per mom, infant not been latching well, she only tried breastfeeding infant once for 5 minutes  In L&D, but she does want to breast and formula feed infant. LC notice mom has wide spaced breast with door knob like nipples and her right breast is inverted. LC suggested she pre-pump her right breast prior to latching infant for a feeding. Mom was fitted with 27 mm breast flanges.  LC unable observe latch due infant receiving 8 mls of formula Similac Advance 20 kcal with iron.  Mom will  continue to work on latching infant at breast, moving forwards she latch infant at breast first before offering formula. Mom was concern she did not have colostrum in breast, LC worked with mom on hand expression and she felt more comfortable now that she is seeing she has colostrum to BF to infant. Mom understands to BF infant by cues, 8 to 12+ times within 24 hours doing STS. Mom knows to call RN or [place] if she needs assistance with latching infant at the breast. Mom will use DEBP to help with breast stimulation and establishing her milk supply, mom know to pump every 3 hours for 15 minutes on initial setting. Mom made aware of O/P services, breastfeeding support groups, community resources, and our phone # for post-discharge questions.  Mom's plan: 1. Mom will BF infant according to cues, 8 to 12+ times within 24 hours according to cues, STS. 2. After  Mom latches  infant at breast for every feeding , dad will continue to supplement infant with formula (  this is the families choice) handout given based on infant's age/ hours of life. 3. Mom will pump every 3 hours for 15 minutes on initial setting to help establish her milk supply. 4. Mom will give EBM first before supplementing infant with formula after she breastfeeds infant.    Maternal Data Formula Feeding for Exclusion: No Has patient been taught Hand Expression?: Yes Does the patient have breastfeeding experience prior to this delivery?: Yes  Feeding Feeding Type: Bottle Fed - Formula  LATCH Score                   Interventions Interventions: Breast feeding basics reviewed;Skin to skin;Breast massage;Expressed milk;Hand express;DEBP  Lactation Tools Discussed/Used WIC Program: Yes Pump Review: Setup, frequency, and cleaning;Milk Storage Initiated by:: Vicente Serene, IBCLC Date initiated:: 10/17/19   Consult Status Consult Status: Follow-up Date: 10/17/19 Follow-up type: In-patient    Vicente Serene 10/16/2019, 10:14 PM

## 2019-10-16 NOTE — Op Note (Signed)
Cesarean Section Procedure Note  Indications: previous uterine incision kerr x 1  Pre-operative Diagnosis:  1. 39 week 0 day pregnancy     2. prior Low transverse cesarean section     3. Right thigh boil  Post-operative Diagnosis: same plus right thigh lipoma  Procedure: Repeat cesarean section plus right thigh lipoma removal   Surgeon: Caffie Damme MD  Assistants: Noralyn Pick CNM  Anesthesia: Spinal anesthesia  ASA Class: 2  Procedure Details   The patient was counseled about the risks, benefits, complications of the cesarean section. The patient concurred with the proposed plan, giving informed consent.  The site of surgery properly noted/marked. The patient was taken to Operating Room # A, identified as Giara A Coor and the procedure verified as C-Section Delivery. A Time Out was held and the above information confirmed.  After spinal was found to adequate , the patient was placed in the dorsal supine position with a leftward tilt, draped and prepped in the usual sterile manner. A Pfannenstiel incision was made and carried down through the subcutaneous tissue to the fascia.  The fascia was incised in the midline and the fascial incision was extended laterally with Mayo scissors. The superior aspect of the fascial incision was grasped with Coker clamps x2, tented up and the rectus muscles dissected off sharply with the scalpel. The rectus was then dissected off with blunt dissection and Mayo scissors inferiorly. The rectus muscles were separated in the midline. The abdominal peritoneum was identified, tented up between two hemostats, entered sharply using Metzenbaum scissors, and the incision was extended superiorly and inferiorly with good visualization of the bladder. The Alexis retractor was then deployed. The vesicouterine peritoneum was identified, tented up, entered sharply with Metzenbaum scissors, and the bladder flap was created digitally. Scalpel was then used to  make a low transverse incision on the uterus which was extended laterally with  blunt dissection. The fetal vertex was identified, the head was brought out from the pelvis. A mity vac vacuum was placed to aid in delivery of the vertex.  The head was then delivered easily through the uterine incision followed by the body. The A live female infant was bulb suctioned on the operative field cried vigorously, cord was clamped and cut and the infant was passed to the waiting neonatologist. Apgars 9/9. Placenta was then delivered manually due to brisk uterine bleeding.  The placenta was intact and appeared normal with 3 vessels noted. The uterine bleeders were clamped with ring forceps and the uterus was cleared of all clot and debris. The uterine incision was repaired with #0 Monocryl in running locked fashion. A second imbricating suture was performed using the same suture. The incision was then oozing so several interrupted sutures of 0-Monocryl was placed and the incision covered with Interceed.   Ovaries and tubes were inspected and normal. The Alexis retractor was removed. The abdominal cavity was cleared of all clot and debris. The abdominal peritoneum was reapproximated with 2-0 chromic  in a running fashion, the rectus muscles was reapproximated with #2 chromic in interrupted fashion. The fascia was closed with looped 0 PDS in a running fashion. The subcuticular layer was irrigated and all bleeders cauterized.    The incision was injected with 20 mL of 0.5% Marcaine. Interrupted sutures of 2-0 plain were used to re-approximate Scarpas fascia.  The skin was closed with 4-0 vicryl in a subcuticular fashion using a Lanny Hurst needle. The incision was dressed with benzoine, steri strips, a honeycomb and  pressure dressing.   The right thigh had already been prepped and it was injected with 10 mL of 0.5% Marcaine.  A 15 blade scalpel was used to make a 2 cm incision along the skin covering a palpable cyst.  A lipoma was  identified and removed in pieces with Metzenbaum scissors.  0-vicryl on a UR-6 was used to close the dead space and the skin closed with interrupted sutures of 2-0 plain gut.   All sponge lap and needle counts were correct x3. Patient tolerated the procedure well and recovered in stable condition following the procedure.  Instrument, sponge, and needle counts were correct prior the abdominal closure and at the conclusion of the case.   Findings: Live female infant, Apgars 9/9 (not in chart), clear amniotic fluid, normal appearing placenta, normal uterus, bilateral tubes and ovaries; Right thigh lipoma  Estimated Blood Loss: 400 mL  IVF: 2500 mL         Drains: Foley catheter  Urine output: 50 mL clear urine         Specimens: Placenta to L&D         Implants: none         Complications:  None; patient tolerated the procedure well.         Disposition: PACU - hemodynamically stable.   Rogelio Seen Tinzley Dalia

## 2019-10-16 NOTE — Anesthesia Procedure Notes (Signed)
Spinal  Patient location during procedure: OB Start time: 10/16/2019 9:54 AM End time: 10/16/2019 10:04 AM Staffing Performed: anesthesiologist  Anesthesiologist: [name], MD Preanesthetic Checklist Completed: patient identified, IV checked, risks and benefits discussed, surgical consent, monitors and equipment checked, pre-op evaluation and timeout performed Spinal Block Patient position: sitting Prep: DuraPrep and site prepped and draped Patient monitoring: heart rate, cardiac monitor, continuous pulse ox and blood pressure Approach: midline Location: L3-4 Injection technique: single-shot Needle Needle type: Quincke  Needle gauge: 22 G Needle length: 15 cm Assessment Sensory level: T4 Additional Notes Difficult Spinal.  Several attempts with 24g pencan.  Several attempts with 9cm 22g Quincke.  Successful block with 15cm 22g Quincke.

## 2019-10-16 NOTE — Anesthesia Preprocedure Evaluation (Signed)
Anesthesia Evaluation  Patient identified by MRN, date of birth, ID band Patient awake    Reviewed: Allergy & Precautions, NPO status , Patient's Chart, lab work & pertinent test results  Airway Mallampati: III  TM Distance: >3 FB Neck ROM: Full    Dental no notable dental hx. (+) Teeth Intact   Pulmonary neg pulmonary ROS,    Pulmonary exam normal breath sounds clear to auscultation       Cardiovascular negative cardio ROS Normal cardiovascular exam Rhythm:Regular Rate:Normal     Neuro/Psych negative neurological ROS  negative psych ROS   GI/Hepatic Neg liver ROS, GERD  Medicated,  Endo/Other  Morbid obesity  Renal/GU negative Renal ROS  negative genitourinary   Musculoskeletal negative musculoskeletal ROS (+)   Abdominal (+) + obese,   Peds  Hematology   Anesthesia Other Findings   Reproductive/Obstetrics (+) Pregnancy 39 4/7 weeks SROM in active labor Genital herpes outbreak                             Anesthesia Physical  Anesthesia Plan  ASA: III  Anesthesia Plan: Spinal   Post-op Pain Management:    Induction:   PONV Risk Score and Plan: 2 and Treatment may vary due to age or medical condition  Airway Management Planned: Natural Airway  Additional Equipment:   Intra-op Plan:   Post-operative Plan:   Informed Consent: I have reviewed the patients History and Physical, chart, labs and discussed the procedure including the risks, benefits and alternatives for the proposed anesthesia with the patient or authorized representative who has indicated his/her understanding and acceptance.       Plan Discussed with: Anesthesiologist, CRNA and Surgeon  Anesthesia Plan Comments:         Anesthesia Quick Evaluation

## 2019-10-16 NOTE — Anesthesia Postprocedure Evaluation (Signed)
Anesthesia Post Note  Patient: Rhonda Barron  Procedure(s) Performed: CESAREAN SECTION (N/A )     Patient location during evaluation: PACU Anesthesia Type: Spinal Level of consciousness: awake and alert Pain management: pain level controlled Vital Signs Assessment: post-procedure vital signs reviewed and stable Respiratory status: spontaneous breathing, nonlabored ventilation and respiratory function stable Cardiovascular status: blood pressure returned to baseline and stable Postop Assessment: no apparent nausea or vomiting Anesthetic complications: no   No complications documented.  Last Vitals:  Vitals:   10/16/19 1235 10/16/19 1236  BP:    Pulse: 81 75  Resp: (!) 21 20  Temp:    SpO2: 100% 100%    Last Pain:  Vitals:   10/16/19 1230  TempSrc:   PainSc: 1    Pain Goal:                   [name]

## 2019-10-17 DIAGNOSIS — Z2839 Other underimmunization status: Secondary | ICD-10-CM

## 2019-10-17 LAB — CBC
HCT: 33.9 % — ABNORMAL LOW (ref 36.0–46.0)
Hemoglobin: 11 g/dL — ABNORMAL LOW (ref 12.0–15.0)
MCH: 29.2 pg (ref 26.0–34.0)
MCHC: 32.4 g/dL (ref 30.0–36.0)
MCV: 89.9 fL (ref 80.0–100.0)
Platelets: 215 10*3/uL (ref 150–400)
RBC: 3.77 MIL/uL — ABNORMAL LOW (ref 3.87–5.11)
RDW: 13.5 % (ref 11.5–15.5)
WBC: 9.3 10*3/uL (ref 4.0–10.5)
nRBC: 0 % (ref 0.0–0.2)

## 2019-10-17 MED ORDER — ENOXAPARIN SODIUM 80 MG/0.8ML ~~LOC~~ SOLN
70.0000 mg | SUBCUTANEOUS | Status: DC
  Administered 2019-10-17 – 2019-10-18 (×2): 70 mg via SUBCUTANEOUS
  Filled 2019-10-17 (×2): qty 0.8

## 2019-10-17 NOTE — Progress Notes (Signed)
MMR VIS given to patient to review. Explained Rubella non-immune status and offered vaccine prior to d/s. Patient to review info and let RN know if she wants the vaccine prior to d/s.   Erlene Quan 10/17/2019 5:36 AM

## 2019-10-17 NOTE — Lactation Note (Signed)
This note was copied from a baby's chart. Lactation Consultation Note  Patient Name: Rhonda Barron Chronis [id number] Date: 10/17/2019 Reason for consult: Follow-up assessment;Mother's request;Difficult latch P3, term female infant less than 24 hours of life.. Per mom, infant has not been latching well, less than one minute when latched. Mom has inverted nipples that are large in diameter, she has been attempting to latch infant then supplementing with formula. Felt little discourage with pumping due to not seeing colostrum when pumping. LC explained this is normal and encouraged mom to continue pumping and hand expressing to help stimulate and establish milk supply. LC unable to assist with latch, due to mom supplementing infant with 8 mls of formula at this time. Mom is to call for [facility] services at next feeding to help assist with latching infant at the breast. Mom's plan; 1. Mom will continue to BF infant with cues, will use a 24 mm NS at next feedings. 2. After latching infant at the breast, mom will continue to supplement infant with formula. 3. Afterwards mom will continue to use DEBP as previously advised.   Maternal Data    Feeding Feeding Type: Breast Fed  LATCH Score                   Interventions    Lactation Tools Discussed/Used     Consult Status Consult Status: Follow-up Date: 10/17/19 Follow-up type: In-patient    Vicente Serene 10/17/2019, 6:40 PM

## 2019-10-17 NOTE — Lactation Note (Signed)
This note was copied from a baby's chart. Lactation Consultation Note  Patient Name: Rhonda Barron CBULA'G Date: 10/17/2019 Reason for consult: Follow-up assessment;Mother's request;Difficult latch;Term P3, 32 hour female term infant with-4% weight loss. LC is assisting mom with latch, LC ask mom to ask Pediatrician  to look at infant's tongue questionable frenulum tongue tie.   Tools given: DEBP due infant previous having  poor latches  and not sustaining latch, mom with large nipples that are inverted. After 24 hours of life mom fitted with 24 mm NS to help infant sustain latch. Mom previously been attempting to BF infant swaddle in clothing with parent's permission [place] unswadled infant so that mom could BF him STS. [place] notice infant became more alert and started cuing to BF. Mom fitted with 24 mm NS that was pre-filled with 0.5 mls of formula, mom latched infant on her her right breast using the football hold, infant sustained latch with the  24 mm NS, which  was pre-filled on 3 times and infant BF for 15 minutes. Per mom, she is  experiencing cramping while feeding infant, RN explained this is normal, RN alert and gave mom pain meds. After latching infant at the breast dad supplemented infant with 23 mls total of Similac Advance with iron. Mom went to restroom as [facility] was leaving the room and plans to use DEBP when she comes out.  Mom's current feeding plan" 1. Mom will apply 24 mm NS prior to latching infant at the breast, will continue to BF infant according to hunger cues, 8 to 12+ times within 24 hours. 2. Afterwards dad will supplement infant with formula based on BF guidelines ( infant's age/ hours of life). 3. Mom will continue to use DEBP every 3 hours for 15 minutes on initial setting, when mom see's colostrum being expressed from pumping she will offer her EBM first and after then the formula. 4. Mom knows to ask RN or LC for further assistance if needed with latching infant at  breast.  Maternal Data    Feeding Feeding Type: Breast Fed  LATCH Score Latch: Grasps breast easily, tongue down, lips flanged, rhythmical sucking.  Audible Swallowing: Spontaneous and intermittent  Type of Nipple: Inverted  Comfort (Breast/Nipple): Soft / non-tender  Hold (Positioning): Assistance needed to correctly position infant at breast and maintain latch.  LATCH Score: 7  Interventions Interventions: Assisted with latch;Skin to skin;Breast compression;Adjust position;Breast massage;Support pillows;Hand express;Position options  Lactation Tools Discussed/Used Tools: Nipple [place] Fuel;Other (comment) (Curve tip syringe) Nipple shield size: 24   Consult Status Consult Status: Follow-up Date: 10/18/19 Follow-up type: In-patient    Vicente Serene 10/17/2019, 6:50 PM

## 2019-10-17 NOTE — Progress Notes (Signed)
Subjective: POD# 1 Repeat cesarean section w/ removal of right thigh lipoma Information for the patient's newborn:  Elyanna, Wallick [[id number]]  female    45 Name [facility] Circumcision consented for in-patient circ  Reports feeling good Feeding: breast and bottle Reports tolerating PO and denies N/V, foley removed, ambulating and urinating w/o difficulty  Pain controlled with PO meds Denies HA/SOB/dizziness  Flatus passing Vaginal bleeding is normal, no clots     Objective:  VS:  Vitals:   10/17/19 0145 10/17/19 0442 10/17/19 0538 10/17/19 0925  BP: 107/68  113/65 110/62  Pulse: 71  69 71  Resp: 16 16 17 20   Temp: 98.9 F (37.2 C)  98.2 F (36.8 C) 98 F (36.7 C)  TempSrc: Oral  Oral Oral  SpO2: 99% 98% 99%   Weight:      Height:        Intake/Output Summary (Last 24 hours) at 10/17/2019 1201 Last data filed at 10/17/2019 0442 Gross per 24 hour  Intake 240 ml  Output 1700 ml  Net -1460 ml     Recent Labs    10/17/19 0526  WBC 9.3  HGB 11.0*  HCT 33.9*  PLT 215    Blood type: --/--/A POS (10/01 7262) Rubella: Nonimmune (04/01 0000)    Physical Exam:  General: alert, cooperative and no distress CV: Regular rate and rhythm or without murmur or extra heart sounds Resp: clear Abdomen: soft, nontender, normal bowel sounds Incision: serous drainage present, pressure dressing removed Uterine Fundus: firm, below umbilicus, nontender Lochia: appropriate, no clots Ext: extremities normal, atraumatic, no cyanosis or edema and edema 1+, non-pitting   Assessment/Plan: 34 y.o.   POD# 1. [id number]                  Active Problems:   Postpartum care following cesarean delivery 10/3   HSV (herpes simplex virus) infection   Morbid obesity ([place])   History of cesarean delivery affecting pregnancy   H/O cesarean section   Rubella nonimmune status, delivered, current hospitalization   Routine post-op PP care          Advance diet as tolerated Advised warm  fluids and ambulation to improve GI motility Encourage rest when baby rests Breastfeeding support PRN Anticipate D/C 10/18/19   Arrie Eastern, MSN, CNM 10/17/2019, 12:01 PM

## 2019-10-17 NOTE — Progress Notes (Signed)
HC dsg with dng and wet, New HC DSG applied using sterile gloves. Pt tolerated procedure well.

## 2019-10-18 MED ORDER — OXYCODONE HCL 5 MG PO TABS
5.0000 mg | ORAL_TABLET | ORAL | 0 refills | Status: DC | PRN
Start: 2019-10-18 — End: 2022-02-12

## 2019-10-18 MED ORDER — IBUPROFEN 800 MG PO TABS
800.0000 mg | ORAL_TABLET | Freq: Three times a day (TID) | ORAL | 0 refills | Status: DC | PRN
Start: 2019-10-18 — End: 2022-03-11

## 2019-10-18 NOTE — Discharge Summary (Signed)
RCS OB Discharge Summary     Patient Name: Rhonda Barron DOB: October 09, 1985 MRN: [id number]  Date of admission: 10/16/2019 Delivering MD: Sanjuana Kava  Date of delivery: 10/16/2019 Type of delivery: RCS  Newborn Data: Sex: Baby female Circumcision: performed in pt Live born female  Birth Weight: 7 lb 3 oz (3260 g) APGAR: 36, 75  Newborn Delivery   Birth date/time: 10/16/2019 10:32:00 Delivery type: C-Section, Low Transverse Trial of labor: No C-section categorization: Repeat      Feeding: breast and bottle Infant being discharge to home with mother in stable condition.   Admitting diagnosis: History of cesarean delivery affecting pregnancy [O34.219] H/O cesarean section [Z98.891] Intrauterine pregnancy: [redacted]w[redacted]d     Secondary diagnosis:  Active Problems:   HSV (herpes simplex virus) infection   Morbid obesity ([place])   History of cesarean delivery affecting pregnancy   H/O cesarean section   Postpartum care following cesarean delivery 10/3   Rubella nonimmune status, delivered, current hospitalization                                Complications: None                                                              Intrapartum Procedures: cesarean: low cervical, transverse Postpartum Procedures: none Complications-Operative and Postpartum: none Augmentation: AROM and at delivery   History of Present Illness: Rhonda Barron is a 34 y.o. female, [phone], who presents at [redacted]w[redacted]d weeks gestation. The patient has been followed at  [facility] Gyne[facility]  Her pregnancy has been complicated by:  Patient Active Problem List   Diagnosis Date Noted  . Postpartum care following cesarean delivery 10/3 10/17/2019  . Rubella nonimmune status, delivered, current hospitalization 10/17/2019  . History of cesarean delivery affecting pregnancy 10/16/2019  . H/O cesarean section 10/16/2019  . Morbid obesity ([place])   . HSV (herpes simplex virus) infection 01/05/2015   . 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 Inguinal hernia - pediatric w/ laparscopic exam 05/15/2014  . Obese 12/23/2010  . History of abnormal Pap smear 12/23/2010    Hospital course:  Sceduled C/S   34 y.o. yo [id number] at [redacted]w[redacted]d was admitted to the hospital 10/16/2019 for scheduled cesarean section with the following indication:Elective Repeat.Delivery details are as follows:  Membrane Rupture Time/Date: 10:30 AM ,10/16/2019   Delivery Method:C-Section, Low Transverse  Details of operation can be found in separate operative note.  Patient had an uncomplicated postpartum course.  She is ambulating, tolerating a regular diet, passing flatus, and urinating well. Patient is discharged home in stable condition on  10/18/19        Newborn Data: Birth date:10/16/2019  Birth time:10:32 AM  Gender:Female  Living status:Living  Apgars:8 ,9  Weight:3260 g     Hospital Course--Scheduled Cesarean: Patient was admitted on 10/3 for a scheduled repeat cesarean delivery.   She was taken to the operating room, where Dr. Alwyn Pea performed a Repeat LTCS under spinal anesthesia, with delivery of a viable baby female circ completed in pt, with weight and Apgars as listed below. Infant was in good condition and remained at the patient's bedside.  The patient was taken to recovery in good condition.  Patient planned to breast and bottle feed.  On post-op day 1, patient was doing well, tolerating a regular diet, with Hgb of 11.  Throughout her stay, her physical exam was WNL, her incision was CDI, and her vital signs remained stable.  By post-op day 1, she was up ad lib, tolerating a regular diet, with good pain control with po med.  She was deemed to have received the full benefit of her hospital stay, and was discharged home in stable condition.  Contraceptive choice was po meds.  Pt meets all criteria for early discharge and desires to go home today.   Physical exam  Vitals:   10/17/19 0925 10/17/19 1506 10/17/19 2128 10/18/19 0600   BP: 110/62 120/70 119/71 118/71  Pulse: 71 79 81 81  Resp: 20 20 19 18   Temp: 98 F (36.7 C) 98.6 F (37 C) 98.5 F (36.9 C) 98.8 F (37.1 C)  TempSrc: Oral Oral Oral Oral  SpO2:  100% 100%   Weight:      Height:       General: alert, cooperative and no distress Lochia: appropriate Uterine Fundus: firm Incision: Healing well with no significant drainage, No significant erythema, Dressing is clean, dry, and intact, honeycomb dressing CDI Perineum: intact DVT Evaluation: No evidence of DVT seen on physical exam. Negative Homan's sign. No cords or calf tenderness. No significant calf/ankle edema.  Labs: Lab Results  Component Value Date   WBC 9.3 10/17/2019   HGB 11.0 (L) 10/17/2019   HCT 33.9 (L) 10/17/2019   MCV 89.9 10/17/2019   PLT 215 10/17/2019   CMP Latest Ref Rng & Units 09/20/2018  Glucose 70 - 99 mg/dL 99  BUN 6 - 20 mg/dL 18  Creatinine 0.44 - 1.00 mg/dL 0.92  Sodium 135 - 145 mmol/L 136  Potassium 3.5 - 5.1 mmol/L 3.3(L)  Chloride 98 - 111 mmol/L 105  CO2 22 - 32 mmol/L 21(L)  Calcium 8.9 - 10.3 mg/dL 8.9  Total Protein 6.5 - 8.1 g/dL 6.7  Total Bilirubin 0.3 - 1.2 mg/dL 0.6  Alkaline Phos 38 - 126 U/L 35(L)  AST 15 - 41 U/L 19  ALT 0 - 44 U/L 15    Date of discharge: 10/18/2019 Discharge Diagnoses: Term Pregnancy-delivered Discharge instruction: per After Visit Summary and "Baby and Me Booklet".  After visit meds:   Activity:           unrestricted and pelvic rest Advance as tolerated. Pelvic rest for 6 weeks.  Diet:                routine Medications: PNV and oxy ir with tylenol Postpartum contraception: Undecided Condition:  Pt discharge to home with baby in stable and  condition   Meds: Allergies as of 10/18/2019      Reactions   Latex Itching      Medication List    STOP taking these medications   guaiFENesin 100 MG/5ML liquid Commonly known as: ROBITUSSIN   valACYclovir 1000 MG tablet Commonly known as: VALTREX     TAKE these  medications   acetaminophen 500 MG tablet Commonly known as: TYLENOL Take 500 mg by mouth every 6 (six) hours as needed for mild pain.   ibuprofen 800 MG tablet Commonly known as: ADVIL Take 1 tablet (800 mg total) by mouth every 8 (eight) hours as needed.   oxyCODONE 5 MG immediate release tablet Commonly known as: Oxy IR/ROXICODONE Take 1 tablet (5 mg total) by mouth every 4 (four) hours  as needed for moderate pain.   PRENATAL VITAMINS PO Take 1 tablet by mouth daily.            Discharge Care Instructions  (From admission, onward)         Start     Ordered   10/18/19 0000  Discharge wound care:       Comments: Take dressing off on day 5-7 postpartum.  Report increased drainage, redness or warmth. Clean with water, let soap trickle down body. Can leave steri strips on until they fall off or take them off gently at day 10. Keep open to air, clean and dry.   10/18/19 0751          Discharge Follow Up:   Follow-up [facility]. Schedule an appointment as soon as possible for a visit in 6 week(s).   Specialty: Obstetrics and Gynecology Contact information: [address] [place] [address] [place], NP-C, CNM 10/18/2019, 10:08 AM  Noralyn Pick, [place]

## 2019-10-18 NOTE — Lactation Note (Signed)
This note was copied from a baby's chart. Lactation Consultation Note  Patient Name: Rhonda Barron WCHEN'I Date: 10/18/2019 Reason for consult: Follow-up assessment   P3, Baby 74 hours old.  Assisted with unwrapping baby for feeding. Mother is breast and formula feeding.  Mother has inverted nipples that now are everted slightly. Attempted latching sleepy baby with and without 24NS. Prefilled nipple shield.  Baby sucked a few times and fell back asleep.  Placed baby STS on mother's chest until next feeding. Mother has personal DEBP at home.  Suggest she continues to post pump 4-6 times per day and give volume back to baby. Feed on demand with cues.  Goal 8-12+ times per day after first 24 hrs.  Place baby STS if not cueing.  Reviewed engorgement care and monitoring voids/stools.     Maternal Data Has patient been taught Hand Expression?: Yes  Feeding Feeding Type: Breast Fed  LATCH Score Latch: Repeated attempts needed to sustain latch, nipple held in mouth throughout feeding, stimulation needed to elicit sucking reflex.  Audible Swallowing: A few with stimulation  Type of Nipple: Inverted  Comfort (Breast/Nipple): Filling, red/small blisters or bruises, mild/mod discomfort  Hold (Positioning): Assistance needed to correctly position infant at breast and maintain latch.  LATCH Score: 4  Interventions Interventions: Breast feeding basics reviewed;Assisted with latch;Skin to skin;Hand express;Adjust position;Support pillows;Comfort gels;DEBP  Lactation Tools Discussed/Used Tools: Nipple Shields;Comfort gels;Pump   Consult Status Consult Status: Complete Date: 10/18/19    Vivianne [facility] 10/18/2019, 10:14 AM

## 2019-11-16 IMAGING — US US OB < 14 WEEKS - US OB TV
1 series · 15 of 28 positions shown · non-contrast
Comparison: None.

CLINICAL DATA: Abdominal pain, right side greater than left, for 2
weeks. 1st trimester pregnancy.

EXAM:
OBSTETRIC <14 WK US AND TRANSVAGINAL OB US
TECHNIQUE: Both transabdominal and transvaginal ultrasound examinations were
performed for complete evaluation of the gestation as well as the
maternal uterus, adnexal regions, and pelvic cul-de-sac.
Transvaginal technique was performed to assess early pregnancy.

[Series 1: us ob < 14 weeks - us ob tv · 15 of 50 slices shown]
[im 1/50]
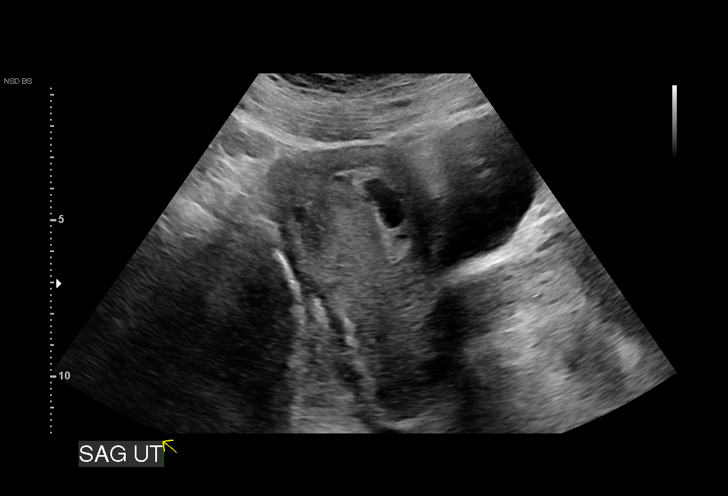
[im 4/50]
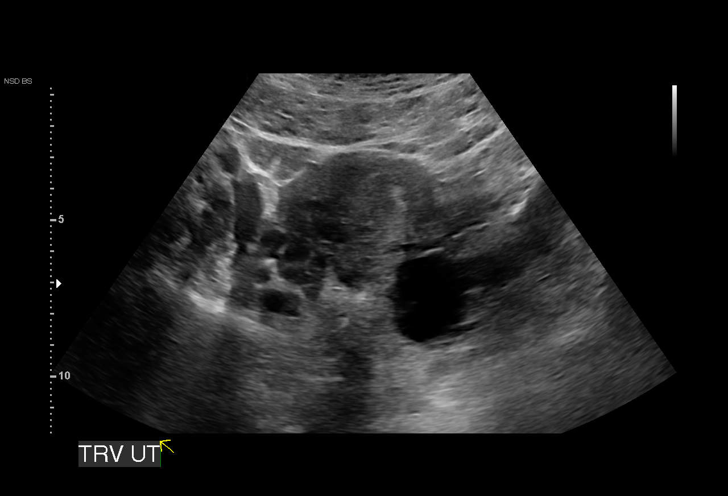
[im 8/50]
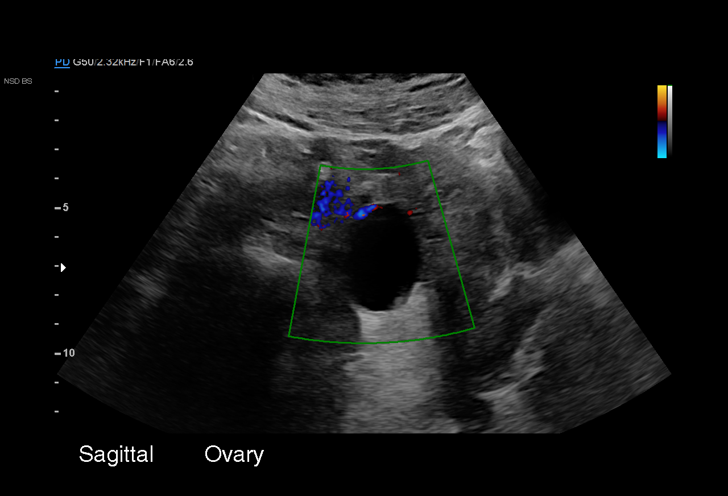
[im 11/50]
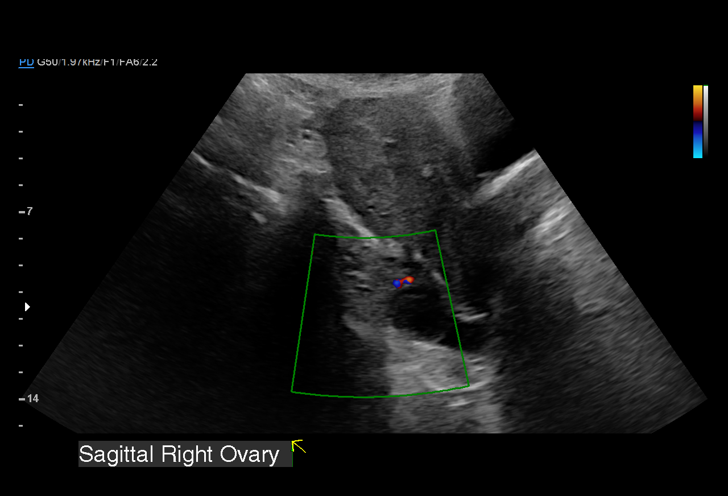
[im 15/50]
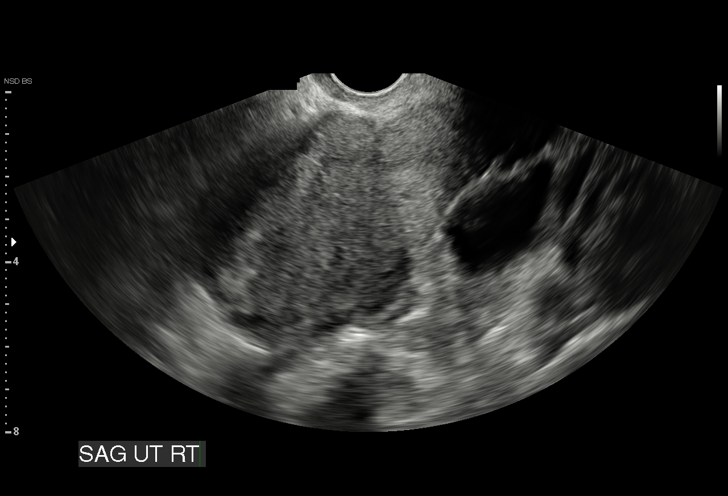
[im 19/50]
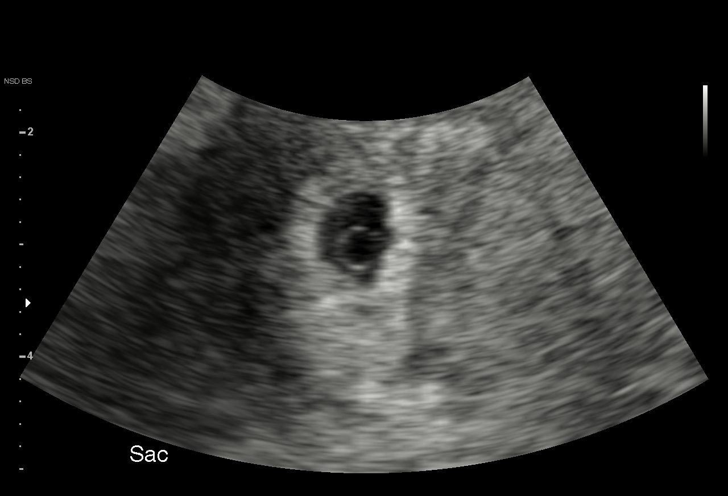
[im 22/50]
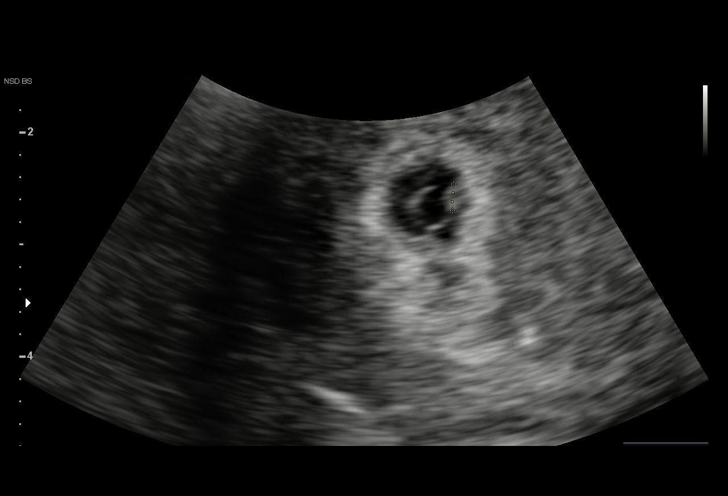
[im 26/50]
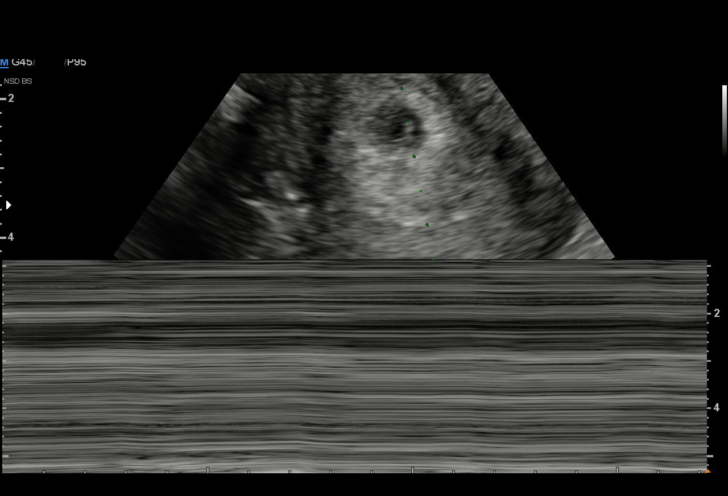
[im 28/50]
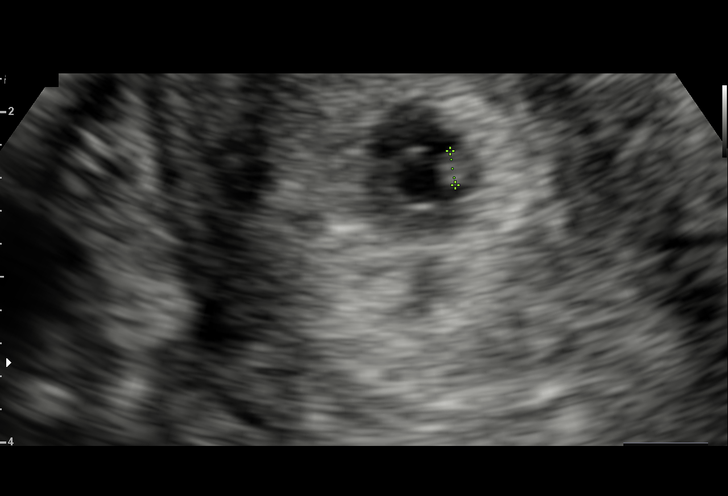
[im 31/50]
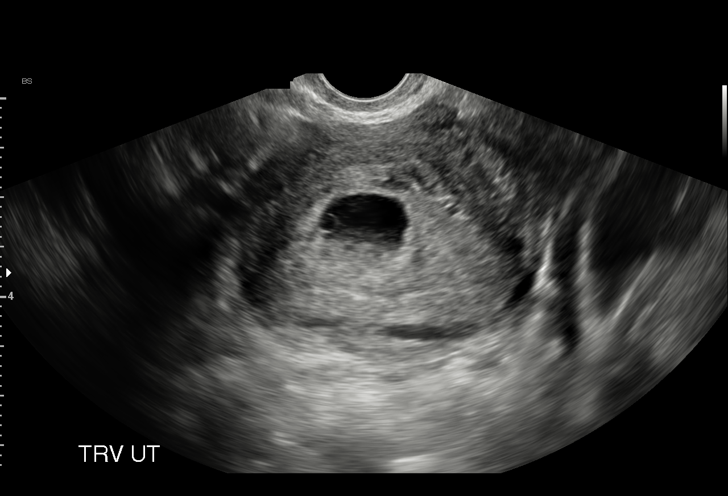
[im 35/50]
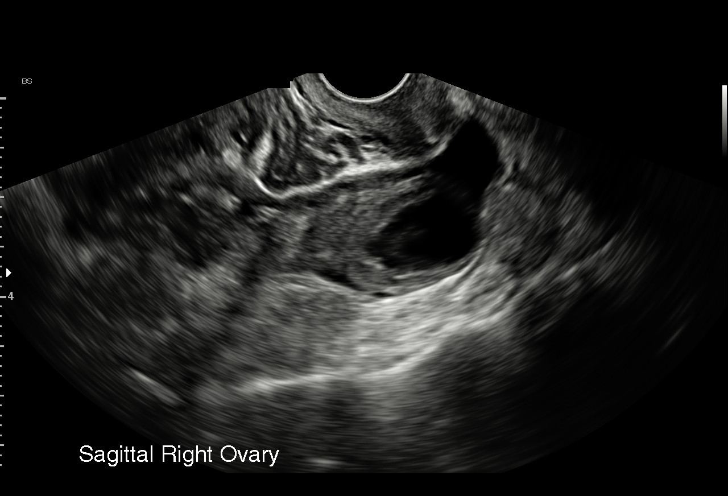
[im 39/50]
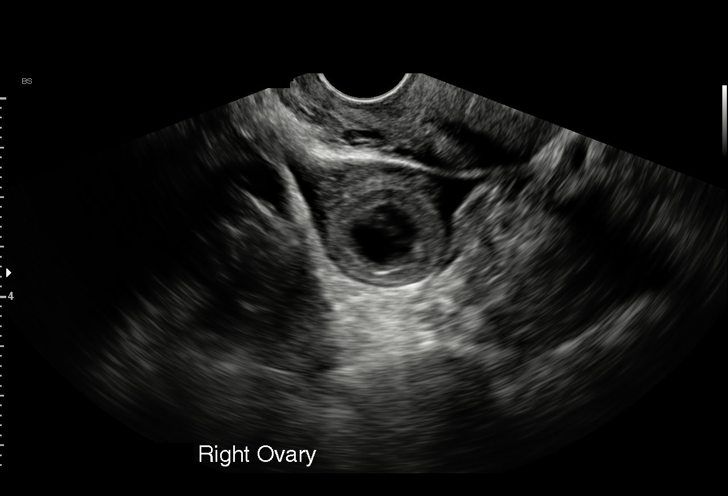
[im 42/50]
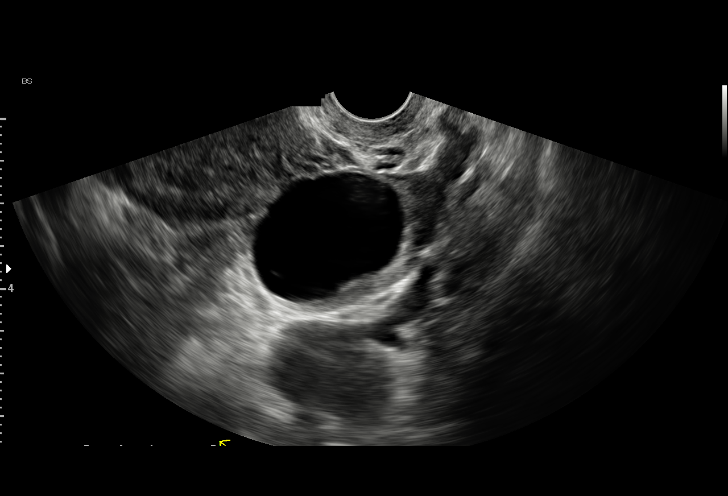
[im 46/50]
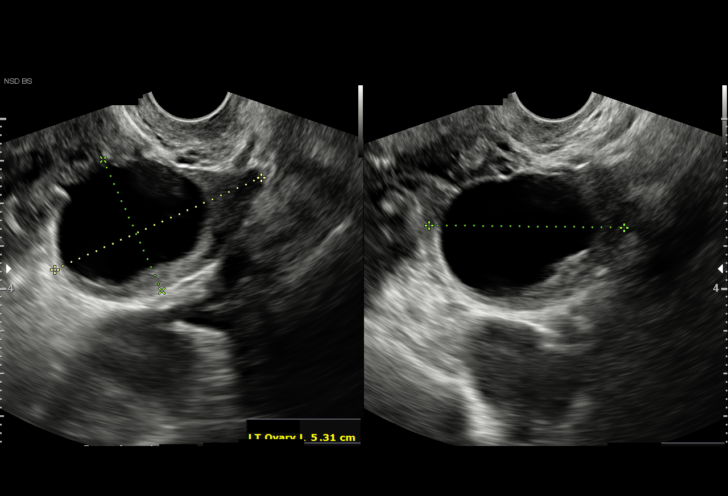
[im 50/50]
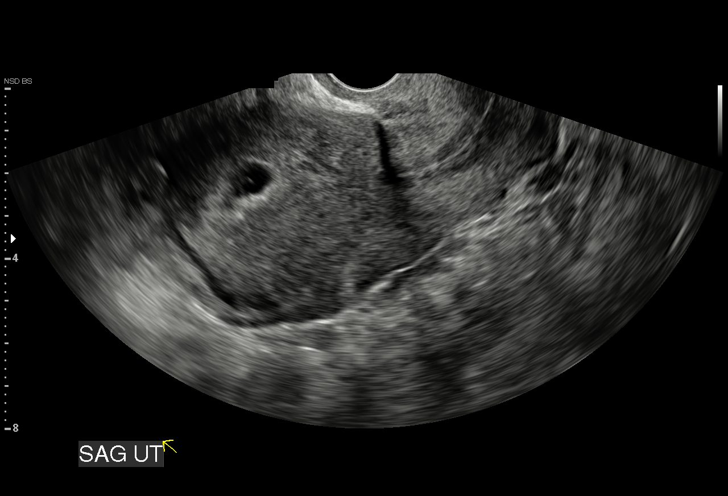

[15 of 28 positions shown; findings below may reference images not displayed]

FINDINGS: Intrauterine gestational sac: Single

Yolk sac:  Visualized.

Embryo:  Visualized.

Cardiac Activity: Not Visualized.

CRL:  2 mm   5 w   5 d                  US EDC: 05/18/2019

Subchorionic hemorrhage:  None visualized.

Maternal uterus/adnexae: A small right ovarian corpus luteum cyst is
noted. A simple left ovarian cyst is also seen measuring 3.9 cm. No
suspicious adnexal mass identified. Tiny amount of simple free fluid
noted.
IMPRESSION: Single IUP measuring 5 weeks 5 days but no definite cardiac activity
detected. Findings are suspicious but not yet definitive for failed
pregnancy. Recommend follow-up US in 7 days for definitive
diagnosis. This recommendation follows SRU consensus guidelines:
Diagnostic Criteria for Nonviable Pregnancy Early in the First
Trimester. N Engl J Med 8241; [DATE].

## 2020-08-23 IMAGING — US US MFM OB DETAIL+14 WK
1 series · 12 of 28 positions shown · non-contrast
Comparison: none

[Series 1: us mfm ob detail+14 wk · 12 of 113 slices shown]
[im 5/113]
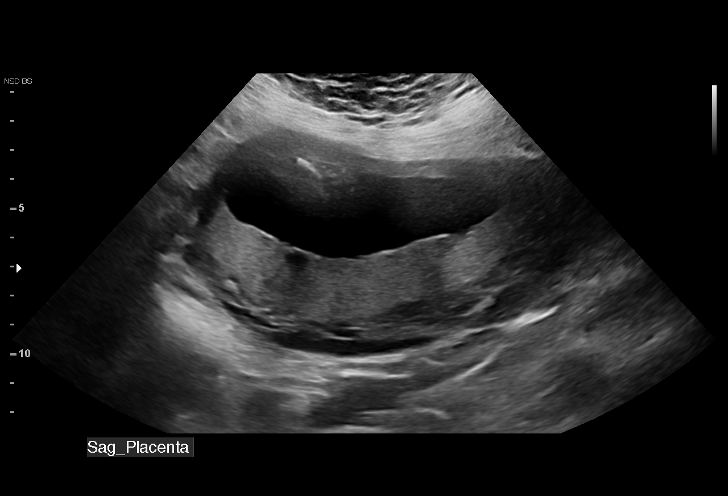
[im 13/113]
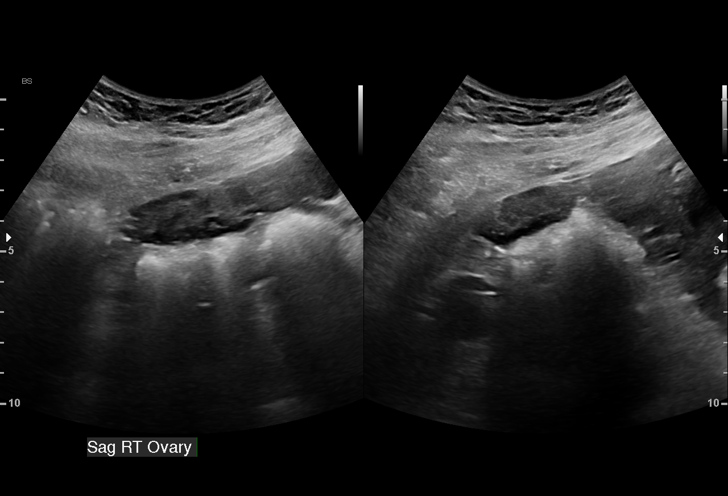
[im 21/113]
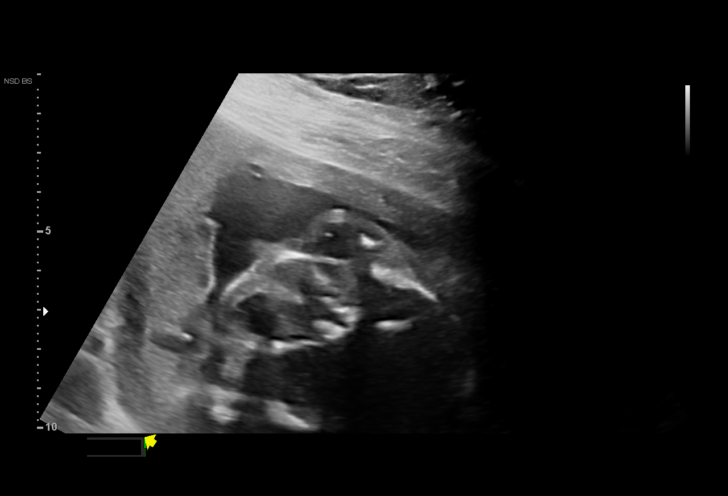
[im 34/113]
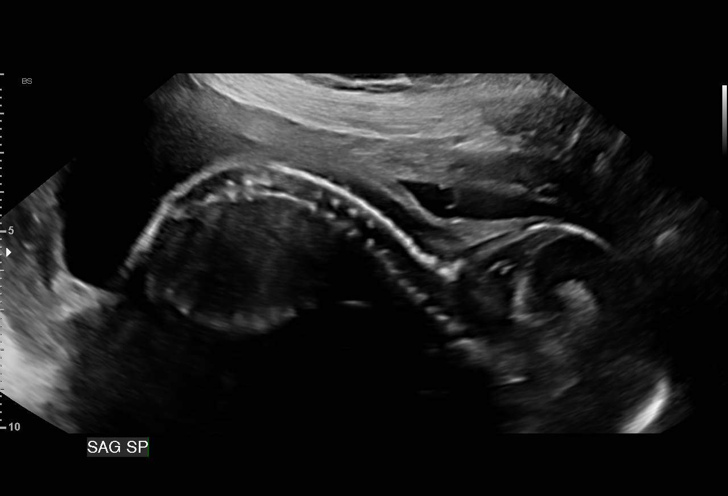
[im 42/113]
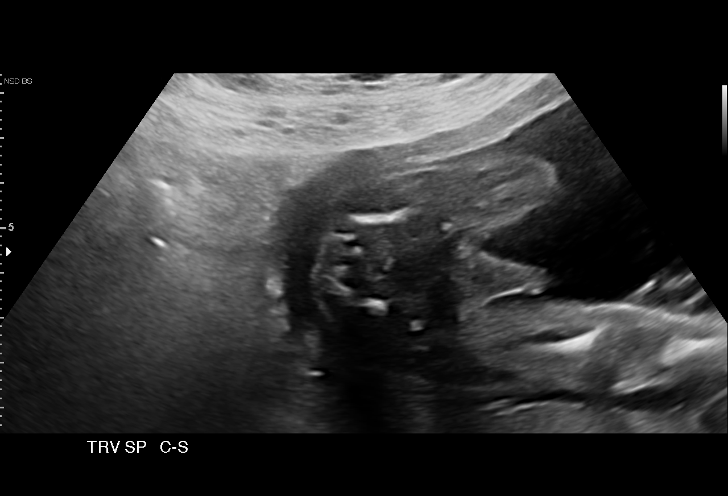
[im 50/113]
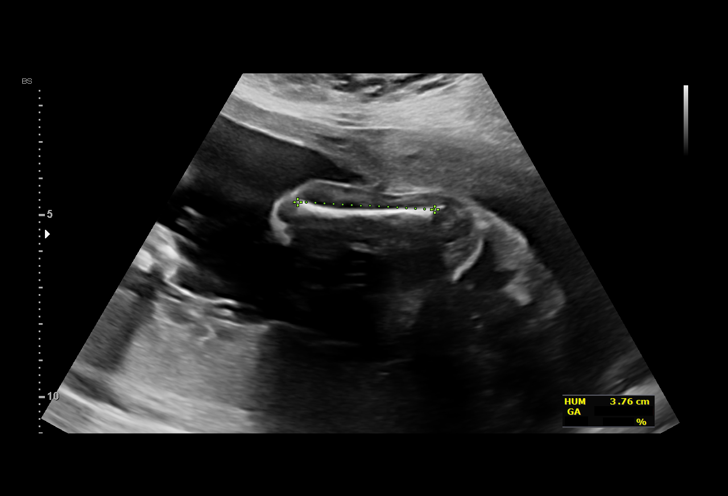
[im 63/113]
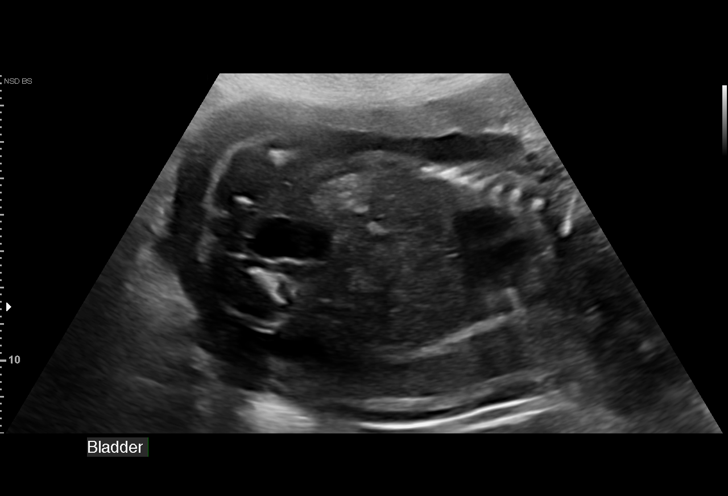
[im 71/113]
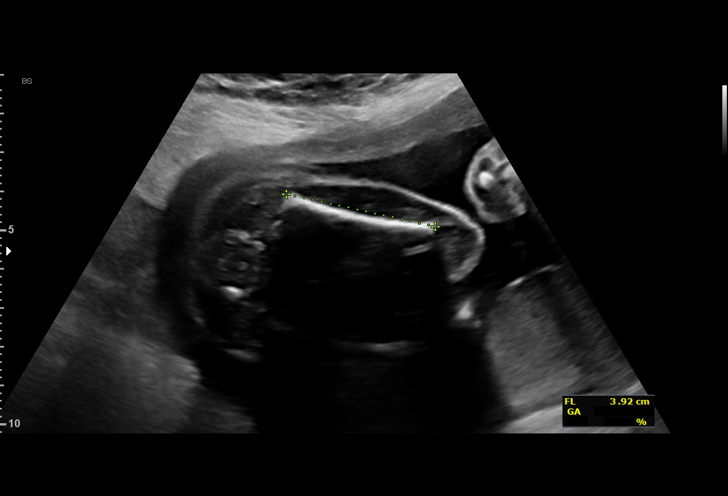
[im 79/113]
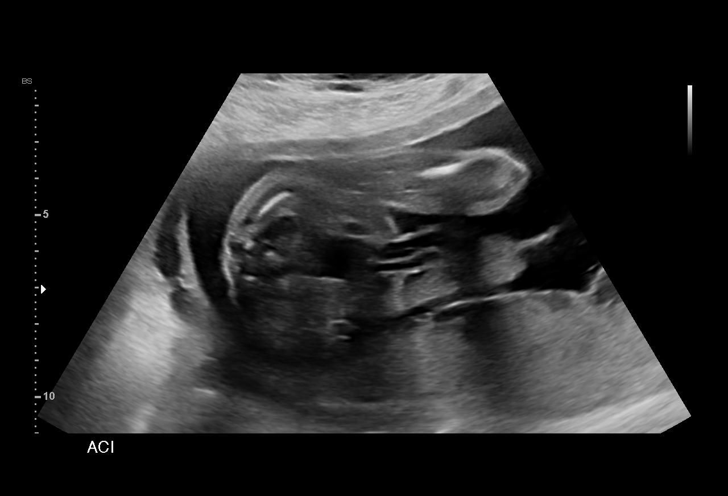
[im 92/113]
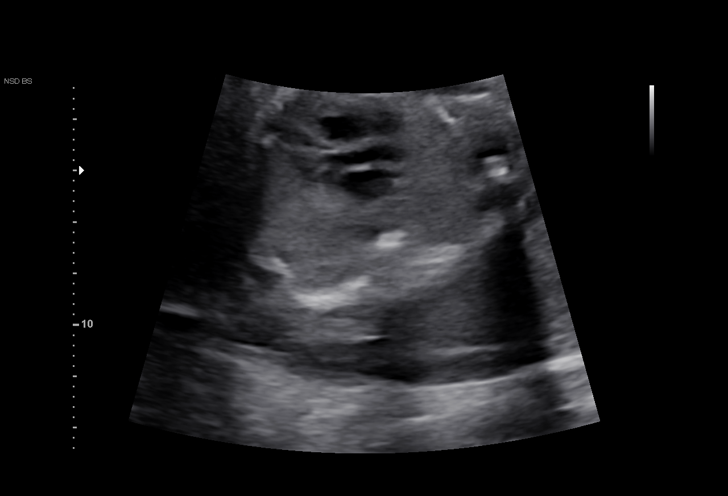
[im 100/113]
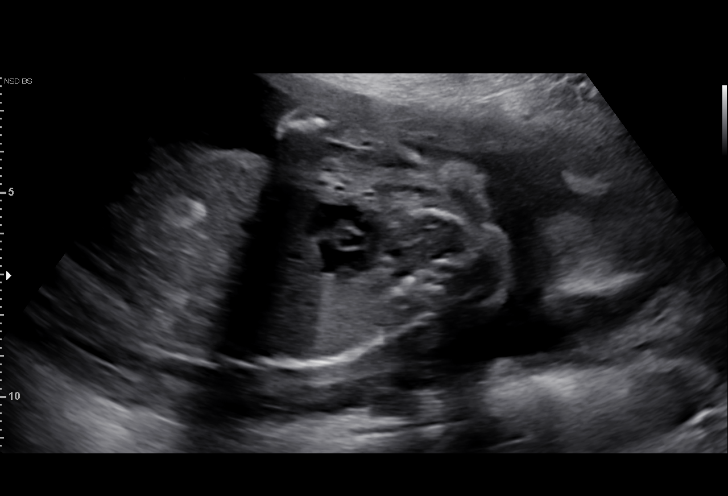
[im 108/113]
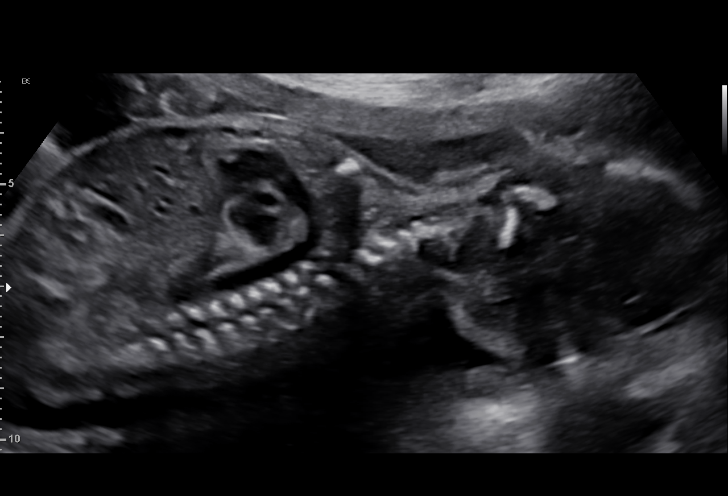

[12 of 28 positions shown; findings below may reference images not displayed]

Obstetrics &
                                                            Gynecology
                                                            [address]
                                                            [place].
                   CNM

Indications

 Obesity complicating pregnancy, second
 trimester (BMI 40)
 Encounter for antenatal screening for
 malformations (LOW Risk NIPS, NEG
 [place], Declined AFP)
 23 weeks gestation of pregnancy
 Herpes simplex virus (LEGNA DUNFORD and II)
 Previous cesarean delivery, antepartum
 Covid +  05/10/19
 Medical complication of pregnancy (Rubella
 Non-immune)
Fetal Evaluation

 Num Of Fetuses:         1
 Fetal Heart Rate(bpm):  145
 Cardiac Activity:       Observed
 Presentation:           Cephalic
 Placenta:               Posterior
 P. Cord Insertion:      Visualized, central

 Amniotic Fluid
 AFI FV:      Within normal limits

                             Largest Pocket(cm)
                             6
Biometry

 BPD:      53.9  mm     G. Age:  22w 3d         15  %    CI:        69.78   %    70 - 86
                                                         FL/HC:      18.9   %    19.2 -
 HC:      205.9  mm     G. Age:  22w 5d         15  %    HC/AC:      1.11        1.05 -
 AC:      184.8  mm     G. Age:  23w 2d         41  %    FL/BPD:     72.2   %    71 - 87
 FL:       38.9  mm     G. Age:  22w 3d         16  %    FL/AC:      21.0   %    20 - 24
 HUM:      37.5  mm     G. Age:  23w 1d         40  %
 CER:        26  mm     G. Age:  23w 4d         80  %
 NFT:       4.8  mm
 LV:        4.9  mm
 CM:        7.7  mm
 Est. FW:     543  gm      1 lb 3 oz     24  %
OB History

 Gravidity:    3         Term:   2        Prem:   0        SAB:   0
 TOP:          0       Ectopic:  0        Living: 2
Gestational Age

 LMP:           23w 2d        Date:  01/16/19                 EDD:   10/23/19
 U/S Today:     22w 5d                                        EDD:   10/27/19
 Best:          23w 2d     Det. By:  LMP  (01/16/19)          EDD:   10/23/19
Anatomy

 Cranium:               Appears normal         LVOT:                   Not well visualized
 Cavum:                 Appears normal         Aortic Arch:            Appears normal
 Ventricles:            Appears normal         Ductal Arch:            Appears normal
 Choroid Plexus:        Appears normal         Diaphragm:              Appears normal
 Cerebellum:            Appears normal         Stomach:                Appears normal, left
                                                                       sided
 Posterior Fossa:       Appears normal         Abdomen:                Echogenic Bowel
 Nuchal Fold:           Not applicable (>20    Abdominal Wall:         Appears nml (cord
                        wks GA)                                        insert, abd wall)
 Face:                  Orbits appear          Cord Vessels:           Appears normal (3
                        normal                                         vessel cord)
 Lips:                  Appears normal         Kidneys:                Appear normal
 Palate:                Not well visualized    Bladder:                Appears normal
 Thoracic:              Appears normal         Spine:                  Appears normal
 Heart:                 Appears normal         Upper Extremities:      Visualized
                        (4CH, axis, and
                        situs)
 RVOT:                  Not well visualized    Lower Extremities:      Appears normal

 Other:  Fetus appears to be a male. Nasal bone visualized. Technically
         difficult due to maternal habitus and fetal position.
Cervix Uterus Adnexa

 Cervix
 Length:           3.63  cm.
 Normal appearance by transabdominal scan.

 Right Ovary
 Within normal limits.
 Left Ovary
 Within normal limits.

 Adnexa
 No abnormality visualized.
Comments

 This patient was seen for a detailed fetal anatomy scan due
 to maternal obesity.
 She denies any significant past medical history and denies
 any problems in her current pregnancy.
 She had a cell free DNA test earlier in her pregnancy which
 indicated a low risk for trisomy 21, 18, and 13. A male fetus is
 predicted.
 She was informed that the fetal growth and amniotic fluid
 level were appropriate for her gestational age.
 On today's exam, echogenic bowel was noted. The causes of
 echogenic bowel including a normal variant, fetal aneuploidy,
 swallowed  blood, cystic fibrosis, and viral infections were
 discussed. She denies any recent vaginal bleeding. She
 should be screened for cystic fibrosis if she has not been
 screened already.
 The patient was counseled that ultrasound is not a substitute
 for the information which can be obtained by amniocentesis.
 The availability of an amniocentesis and the potential risks
 (pregnancy loss [DATE]) and benefits of this procedure
 were also discussed and questions were answered.  The
 patient declined the procedure. The limitations of ultrasound
 in the detection of all anomalies was discussed today.
 Due to the echogenic bowel noted today, she had her blood
 drawn for CMV and toxoplasmosis.  I will notify the patient
 regarding her CMV and toxoplasmosis test results.
 The views of the fetal anatomy were suboptimal today due to
 the fetal position.
 Due to the potential association of fetal growth restriction with
 echogenic bowel, the patient should continue to be followed
 with growth ultrasounds.
 A follow up exam was scheduled in 4 weeks to assess the
 fetal growth and to complete the views of the fetal anatomy.
 Addendum:06/29/2019
 The patient's CMV and toxoplasmosis tests were all negative
 for an acute infection.  The patient was notified via telephone
 regarding her negative test results.

## 2020-10-23 ENCOUNTER — Encounter (HOSPITAL_COMMUNITY): Payer: Self-pay

## 2020-10-23 ENCOUNTER — Ambulatory Visit (HOSPITAL_COMMUNITY)
Admission: EM | Admit: 2020-10-23 | Discharge: 2020-10-23 | Disposition: A | Discharge status: Home / Self Care | Payer: Medicaid Other | Attending: Urgent Care | Admitting: Urgent Care

## 2020-10-23 ENCOUNTER — Other Ambulatory Visit: Payer: Self-pay

## 2020-10-23 DIAGNOSIS — J069 Acute upper respiratory infection, unspecified: Secondary | ICD-10-CM | POA: Diagnosis not present

## 2020-10-23 DIAGNOSIS — Z20822 Contact with and (suspected) exposure to covid-19: Secondary | ICD-10-CM | POA: Insufficient documentation

## 2020-10-23 MED ORDER — ACETAMINOPHEN 325 MG PO TABS
ORAL_TABLET | ORAL | Status: AC
  Filled 2020-10-23: qty 2

## 2020-10-23 MED ORDER — BENZONATATE 100 MG PO CAPS: 100.0000 mg | ORAL_CAPSULE | Freq: Three times a day (TID) | ORAL | 0 refills | Status: DC | PRN

## 2020-10-23 MED ORDER — CETIRIZINE HCL 10 MG PO TABS: 10.0000 mg | ORAL_TABLET | Freq: Every day | ORAL | 0 refills | Status: AC

## 2020-10-23 MED ORDER — ACETAMINOPHEN 325 MG PO TABS
650.0000 mg | ORAL_TABLET | Freq: Once | ORAL | Status: AC
  Administered 2020-10-23: 650 mg via ORAL

## 2020-10-23 MED ORDER — PSEUDOEPHEDRINE HCL 60 MG PO TABS: 60.0000 mg | ORAL_TABLET | Freq: Three times a day (TID) | ORAL | 0 refills | Status: DC | PRN

## 2020-10-23 MED ORDER — PROMETHAZINE-DM 6.25-15 MG/5ML PO SYRP: 5.0000 mL | ORAL_SOLUTION | Freq: Every evening | ORAL | 0 refills | Status: DC | PRN

## 2020-10-23 NOTE — Discharge Instructions (Signed)
We will notify you of your COVID-19 test results as they arrive and may take between 48-72 hours.  I encourage you to sign up for MyChart if you have not already done so as this can be the easiest way for us to communicate results to you online or through a phone app.  Generally, we only contact you if it is a positive COVID result.  In the meantime, if you develop worsening symptoms including fever, chest pain, shortness of breath despite our current treatment plan then please report to the emergency room as this may be a sign of worsening status from possible COVID-19 infection.  Otherwise, we will manage this as a viral syndrome. For sore throat or cough try using a honey-based tea. Use 3 teaspoons of honey with juice squeezed from half lemon. Place shaved pieces of ginger into 1/2-1 cup of water and warm over stove top. Then mix the ingredients and repeat every 4 hours as needed. Please take Tylenol 500mg-650mg every 6 hours for aches and pains, fevers. Hydrate very well with at least 2 liters of water. Eat light meals such as soups to replenish electrolytes and soft fruits, veggies. Start an antihistamine like Zyrtec for postnasal drainage, sinus congestion.  You can take this together with pseudoephedrine (Sudafed) at a dose of 60 mg 2-3 times a day as needed for the same kind of congestion.    

## 2020-10-23 NOTE — ED Provider Notes (Signed)
[place]   MRN: [id number] DOB: [birth year]  Subjective:   Rhonda Barron is a 35 y.o. female presenting for 2-day history of acute onset runny nose, throat pain, decreased appetite, sinus headache, coughing. Denies chest pain, shob, wheezing, body aches.  No history of asthma.    Current Facility-Administered Medications:    acetaminophen (TYLENOL) tablet 650 mg, 650 mg, Oral, Once, Jaynee Eagles, PA-C  Current Outpatient Medications:    acetaminophen (TYLENOL) 500 MG tablet, Take 500 mg by mouth every 6 (six) hours as needed for mild pain. , Disp: , Rfl:    ibuprofen (ADVIL) 800 MG tablet, Take 1 tablet (800 mg total) by mouth every 8 (eight) hours as needed., Disp: 30 tablet, Rfl: 0   oxyCODONE (OXY IR/ROXICODONE) 5 MG immediate release tablet, Take 1 tablet (5 mg total) by mouth every 4 (four) hours as needed for moderate pain., Disp: 15 tablet, Rfl: 0   Prenatal Vit-Fe Fumarate-FA (PRENATAL VITAMINS PO), Take 1 tablet by mouth daily. , Disp: , Rfl:    Allergies  Allergen Reactions   Latex Itching    Past Medical History:  Diagnosis Date   Abnormal Pap smear    Anemia    History of chlamydia    Morbid obesity ([place])    Vaginal Pap smear, abnormal      Past Surgical History:  Procedure Laterality Date   CESAREAN SECTION N/A 01/05/2015   Procedure: CESAREAN SECTION;  Surgeon: Ena Dawley, MD;  Location: [facility];  Service: Obstetrics;  Laterality: N/A;   CESAREAN SECTION N/A 10/16/2019   Procedure: CESAREAN SECTION;  Surgeon: Sanjuana Kava, MD;  Location: [facility];  Service: Obstetrics;  Laterality: N/A;   INGUINAL HERNIA PEDIATRIC WITH LAPAROSCOPIC EXAM     NO PAST SURGERIES      Family History  Problem Relation Age of Onset   Hypertension Father    Diabetes Father    Asthma Brother    Stroke Paternal Uncle    Healthy Mother    Anesthesia problems Neg Hx    Hypotension Neg Hx    Pseudochol deficiency Neg Hx    Malignant hyperthermia Neg Hx      Social History   Tobacco Use   Smoking status: Never   Smokeless tobacco: Never  Vaping Use   Vaping Use: Never used  Substance Use Topics   Alcohol use: Yes    Comment: occ   Drug use: No    ROS   Objective:   Vitals: BP 115/61 (BP Location: Other (Comment)) Comment (BP Location): forearm  Pulse 98   Temp (!) 100.4 F (38 C) (Oral)   Resp 17   LMP 09/28/2020 (Exact Date)   SpO2 100%   Breastfeeding No   Physical Exam Constitutional:      General: She is not in acute distress.    Appearance: Normal appearance. She is well-developed. She is not ill-appearing, toxic-appearing or diaphoretic.  HENT:     Head: Normocephalic and atraumatic.     Right Ear: Tympanic membrane, ear canal and external ear normal. No drainage or tenderness. No middle ear effusion. Tympanic membrane is not erythematous.     Left Ear: Tympanic membrane, ear canal and external ear normal. No drainage or tenderness.  No middle ear effusion. Tympanic membrane is not erythematous.     Nose: Congestion and rhinorrhea present.     Mouth/Throat:     Mouth: Mucous membranes are moist. No oral lesions.  Pharynx: Oropharynx is clear. No pharyngeal swelling, oropharyngeal exudate, posterior oropharyngeal erythema or uvula swelling.     Tonsils: No tonsillar exudate or tonsillar abscesses.  Eyes:     General: No scleral icterus.    Extraocular Movements: Extraocular movements intact.     Right eye: Normal extraocular motion.     Left eye: Normal extraocular motion.     Conjunctiva/sclera: Conjunctivae normal.     Pupils: Pupils are equal, round, and reactive to light.  Cardiovascular:     Rate and Rhythm: Normal rate and regular rhythm.     Pulses: Normal pulses.     Heart sounds: Normal heart sounds. No murmur heard.   No friction rub. No gallop.  Pulmonary:     Effort: Pulmonary effort is normal. No respiratory distress.     Breath sounds: Normal breath sounds. No stridor. No wheezing,  rhonchi or rales.  Musculoskeletal:     Cervical back: Normal range of motion and neck supple.  Lymphadenopathy:     Cervical: No cervical adenopathy.  Skin:    General: Skin is warm and dry.     Findings: No rash.  Neurological:     General: No focal deficit present.     Mental Status: She is alert and oriented to person, place, and time.  Psychiatric:        Mood and Affect: Mood normal.        Behavior: Behavior normal.        Thought Content: Thought content normal.   APAP in clinic for fever.   Assessment and Plan :   PDMP not reviewed this encounter.  1. Viral URI with cough     Will manage for viral illness such as viral URI, viral syndrome, viral rhinitis, COVID-19. Counseled patient on nature of COVID-19 including modes of transmission, diagnostic testing, management and supportive care.  Offered scripts for symptomatic relief. COVID 19 testing is pending. Counseled patient on potential for adverse effects with medications prescribed/recommended today, ER and return-to-clinic precautions discussed, patient verbalized understanding.     Jaynee Eagles, [place] 10/23/20 [id number]

## 2020-10-24 LAB — SARS CORONAVIRUS 2 (TAT 6-24 HRS): SARS Coronavirus 2: NEGATIVE

## 2020-10-26 IMAGING — US US MFM OB FOLLOW-UP
1 series · 13 of 28 positions shown · non-contrast
Comparison: none

[Series 1: us mfm ob follow-up · 39 acquisitions, 13 frames shown]
[im 2/39]
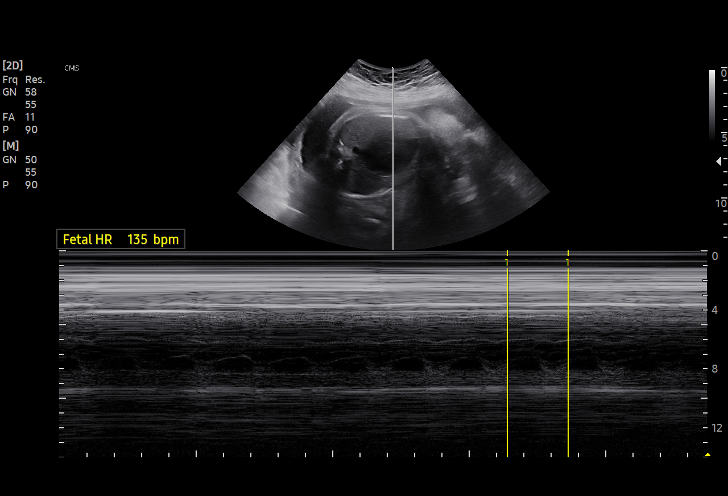
[im 5/39]
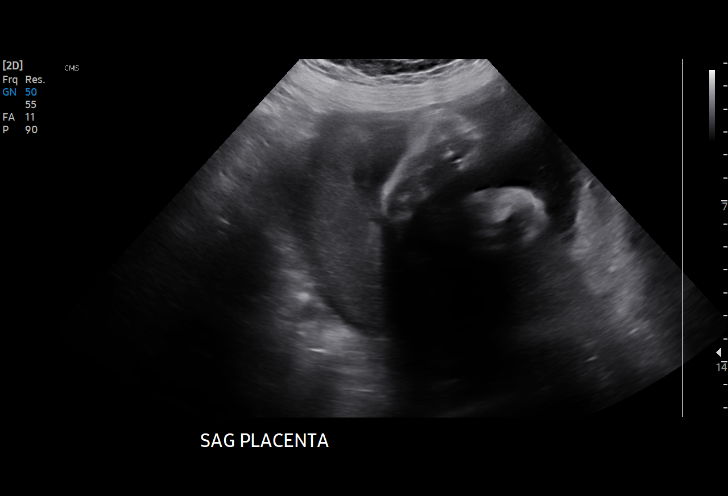
[im 8/39]
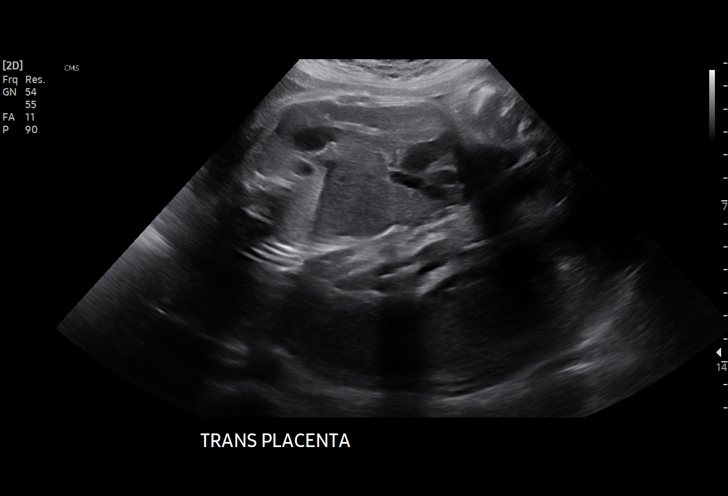
[im 10/39]
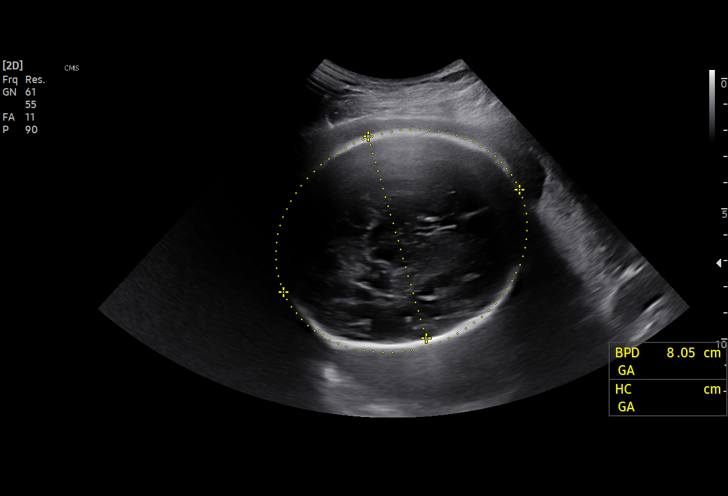
[im 13/39]
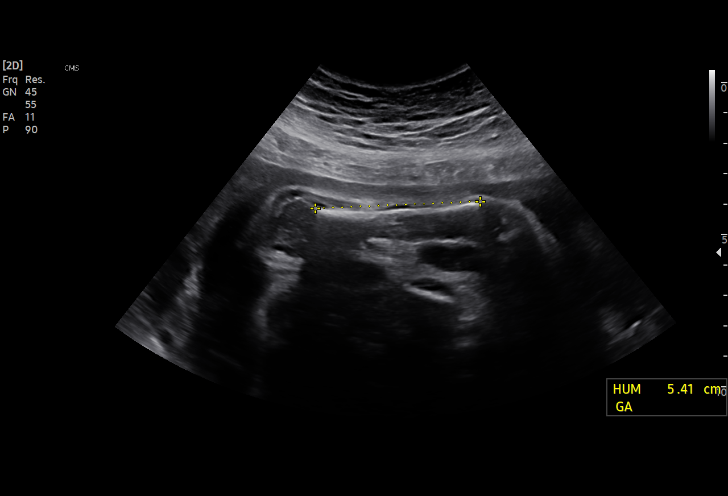
[im 16/39]
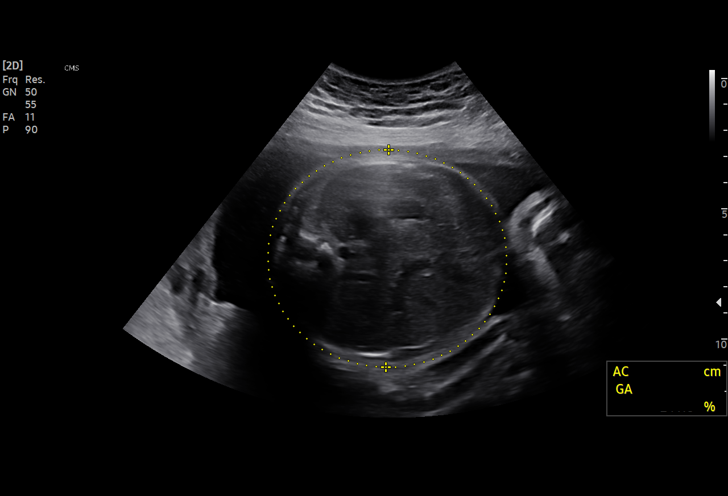
[im 20/39]
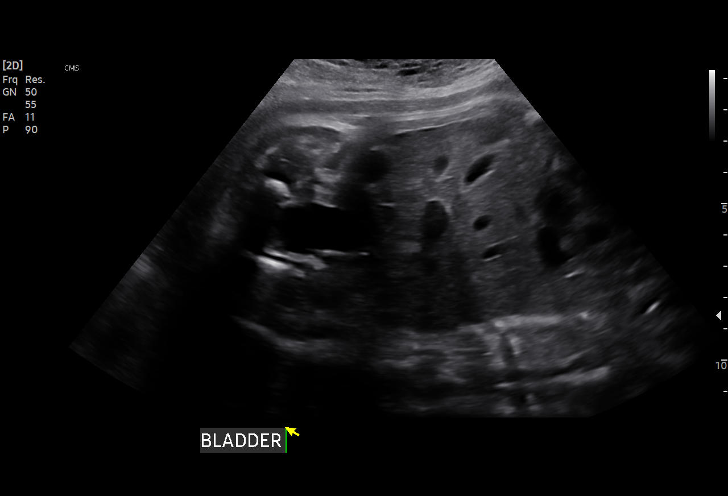
[im 23/39]
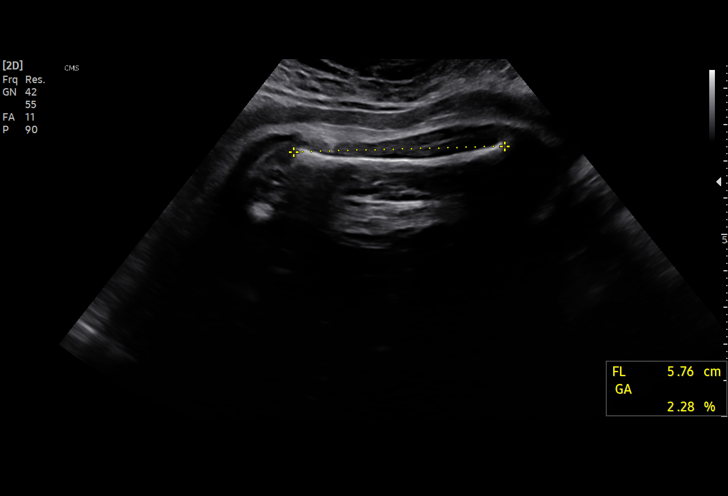
[im 26/39]
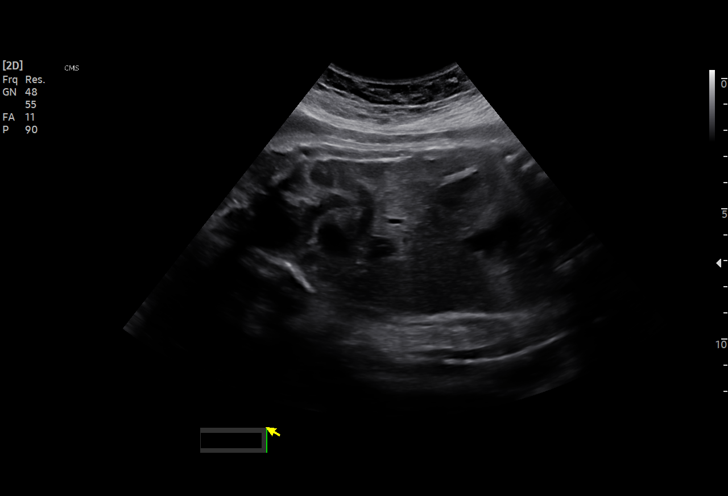
[im 29/39]
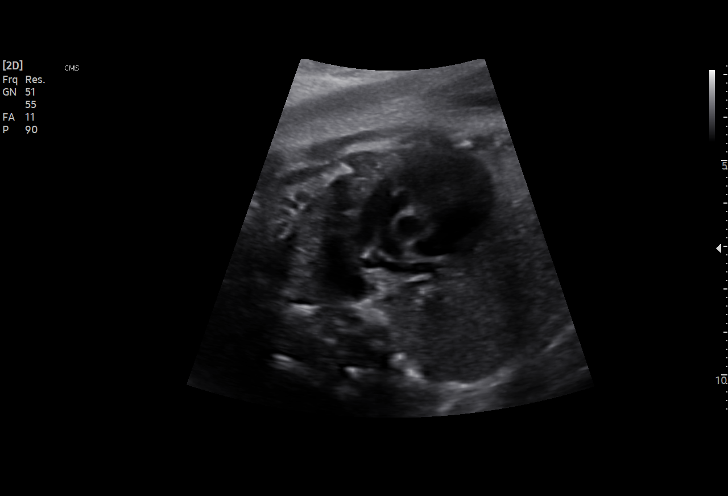
[im 31/39]
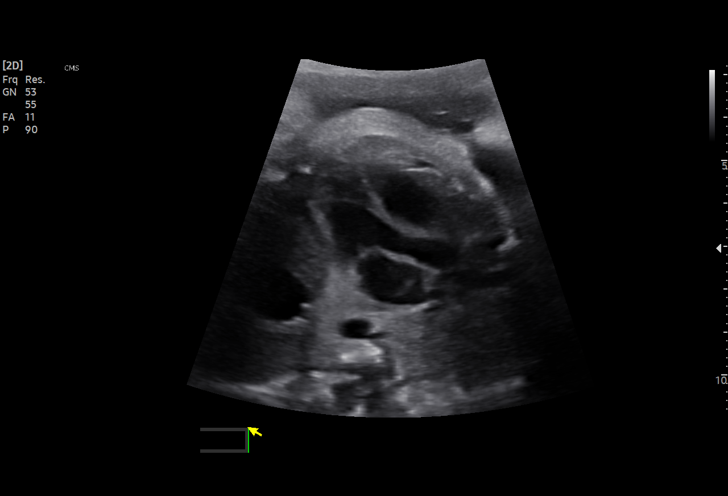
[im 34/39]
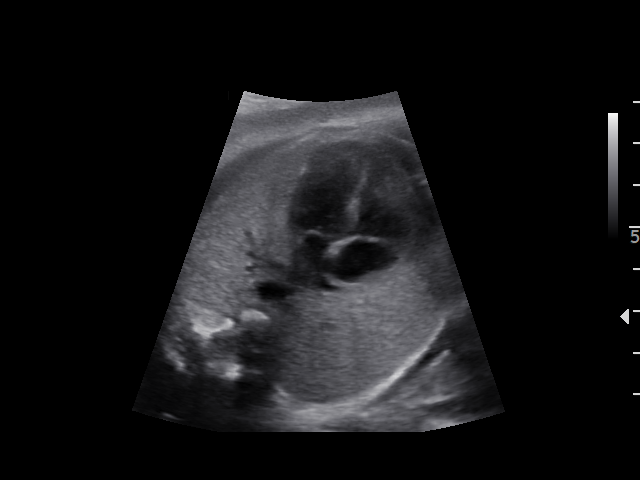
[im 37/39]
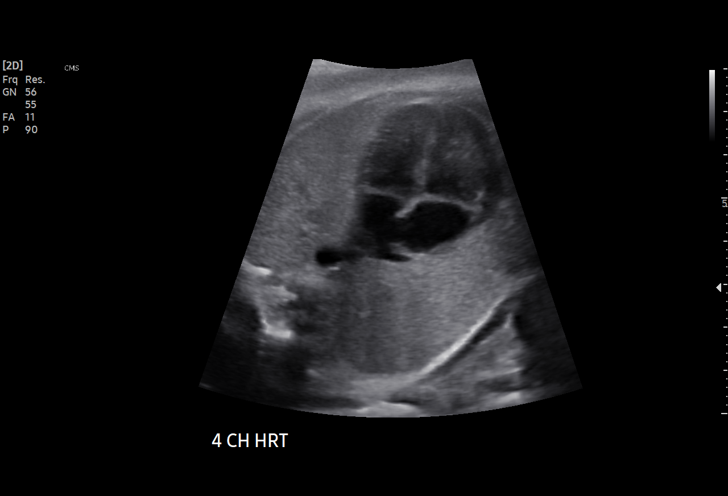

[13 of 28 positions shown; findings below may reference images not displayed]

Obstetrics &
                                                            Gynecology
                                                            [address]
                                                            George Gerardo.
                   CNM

Indications

 Obesity complicating pregnancy, third
 trimester (BMI 40)
 32 weeks gestation of pregnancy
 Echogenic bowel
 Encounter for antenatal screening for
 malformations (LOW Risk NIPS, NEG
 Horizon, Declined AFP)
 Herpes simplex virus ([facility] and II)
 Previous cesarean delivery, antepartum
 Covid +  05/10/19
 Medical complication of pregnancy (Rubella
 Non-immune)
 Antenatal follow-up for nonvisualized fetal
 anatomy
 Encounter for other antenatal screening
 follow-up
Fetal Evaluation

 Num Of Fetuses:         1
 Fetal Heart Rate(bpm):  135
 Cardiac Activity:       Observed
 Presentation:           Cephalic
 Placenta:               Posterior
 P. Cord Insertion:      Previously Visualized
 Amniotic Fluid
 AFI FV:      Within normal limits

 AFI Sum(cm)     %Tile       Largest Pocket(cm)
 15.33           54

 RUQ(cm)       RLQ(cm)       LUQ(cm)        LLQ(cm)

Biometry

 BPD:      80.3  mm     G. Age:  32w 2d         36  %    CI:        79.03   %    70 - 86
                                                         FL/HC:      20.4   %    19.1 -
 HC:      285.6  mm     G. Age:  31w 3d        3.1  %    HC/AC:      1.04        0.96 -
 AC:      274.7  mm     G. Age:  31w 4d         24  %    FL/BPD:     72.6   %    71 - 87
 FL:       58.3  mm     G. Age:  30w 3d        3.8  %    FL/AC:      21.2   %    20 - 24
 HUM:      54.8  mm     G. Age:  31w 6d         43  %
 LV:        3.3  mm

 Est. FW:    3021  gm    3 lb 13 oz      11  %
OB History

 Gravidity:    3         Term:   2        Prem:   0        SAB:   0
 TOP:          0       Ectopic:  0        Living: 2
Gestational Age

 LMP:           32w 3d        Date:  01/16/19                 EDD:   10/23/19
 U/S Today:     31w 3d                                        EDD:   10/30/19
 Best:          32w 3d     Det. By:  LMP  (01/16/19)          EDD:   10/23/19
Anatomy

 Cranium:               Appears normal         LVOT:                   Appears normal
 Cavum:                 Previously seen        Aortic Arch:            Previously seen
 Ventricles:            Appears normal         Ductal Arch:            Previously seen
 Choroid Plexus:        Previously seen        Diaphragm:              Appears normal
 Cerebellum:            Previously seen        Stomach:                Appears normal, left
                                                                       sided
 Posterior Fossa:       Previously seen        Abdomen:                Echogenic Bowel
                                                                       prev.
 Nuchal Fold:           Not applicable (>20    Abdominal Wall:         Previously seen
                        wks GA)
 Face:                  Orbits and profile     Cord Vessels:           Previously seen
                        previously seen
 Lips:                  Previously seen        Kidneys:                Appear normal
 Palate:                Not well visualized    Bladder:                Appears normal
 Thoracic:              Appears normal         Spine:                  Previously seen
 Heart:                 Appears normal         Upper Extremities:      Previously seen
                        (4CH, axis, and
                        situs)
 RVOT:                  Appears normal         Lower Extremities:      Previously seen

 Other:  Fetus appeared to be a male previously. Nasal bone visualized
         previously.  Technically difficult due to maternal habitus and fetal
         position.
Cervix Uterus Adnexa

 Cervix
 Not visualized (advanced GA >87wks)

 Right Ovary
 Previously seen

 Left Ovary
 Previously seen.
Impression

 Maternal obesity. Patient returned for fetal growth
 assessment.  Patient does not have gestational diabetes.
 Infectious work-up for hyper echogenic bowel was negative.

 On today's ultrasound, the estimated fetal weight is at the
 11th percentile.  Abdominal circumference measurement is at
 the 24th percentile.Amniotic fluid is normal and good fetal
 activity is seen .
 Fetal bowel appears normal and does not appear hyper
 echogenic.

 I reassured the patient of the findings.
Recommendations

 -An appointment was made for her to return in 3 weeks for
 fetal growth assessment.
                 [place], [place]

## 2020-11-17 ENCOUNTER — Ambulatory Visit (HOSPITAL_COMMUNITY)
Admission: EM | Admit: 2020-11-17 | Discharge: 2020-11-17 | Disposition: A | Discharge status: Home / Self Care | Payer: Medicaid Other

## 2020-11-17 ENCOUNTER — Other Ambulatory Visit: Payer: Self-pay

## 2021-10-08 ENCOUNTER — Encounter (HOSPITAL_BASED_OUTPATIENT_CLINIC_OR_DEPARTMENT_OTHER): Payer: Self-pay | Admitting: Emergency Medicine

## 2021-10-08 ENCOUNTER — Emergency Department (HOSPITAL_BASED_OUTPATIENT_CLINIC_OR_DEPARTMENT_OTHER)
Admission: EM | Admit: 2021-10-08 | Discharge: 2021-10-08 | Disposition: A | Discharge status: Home / Self Care | Payer: Medicaid Other | Attending: Emergency Medicine | Admitting: Emergency Medicine

## 2021-10-08 ENCOUNTER — Other Ambulatory Visit: Payer: Self-pay

## 2021-10-08 DIAGNOSIS — R002 Palpitations: Secondary | ICD-10-CM

## 2021-10-08 DIAGNOSIS — R252 Cramp and spasm: Secondary | ICD-10-CM | POA: Insufficient documentation

## 2021-10-08 LAB — CBC WITH DIFFERENTIAL/PLATELET
Abs Immature Granulocytes: 0.02 10*3/uL (ref 0.00–0.07)
Basophils Absolute: 0 10*3/uL (ref 0.0–0.1)
Basophils Relative: 1 %
Eosinophils Absolute: 0.2 10*3/uL (ref 0.0–0.5)
Eosinophils Relative: 2 %
HCT: 38.1 % (ref 36.0–46.0)
Hemoglobin: 12.9 g/dL (ref 12.0–15.0)
Immature Granulocytes: 0 %
Lymphocytes Relative: 40 %
Lymphs Abs: 2.6 10*3/uL (ref 0.7–4.0)
MCH: 30.5 pg (ref 26.0–34.0)
MCHC: 33.9 g/dL (ref 30.0–36.0)
MCV: 90.1 fL (ref 80.0–100.0)
Monocytes Absolute: 0.7 10*3/uL (ref 0.1–1.0)
Monocytes Relative: 10 %
Neutro Abs: 3 10*3/uL (ref 1.7–7.7)
Neutrophils Relative %: 47 %
Platelets: 273 10*3/uL (ref 150–400)
RBC: 4.23 MIL/uL (ref 3.87–5.11)
RDW: 13.1 % (ref 11.5–15.5)
WBC: 6.5 10*3/uL (ref 4.0–10.5)
nRBC: 0 % (ref 0.0–0.2)

## 2021-10-08 LAB — URINALYSIS, MICROSCOPIC (REFLEX): RBC / HPF: NONE SEEN RBC/hpf (ref 0–5)

## 2021-10-08 LAB — COMPREHENSIVE METABOLIC PANEL
ALT: 18 U/L (ref 0–44)
AST: 20 U/L (ref 15–41)
Albumin: 3.4 g/dL — ABNORMAL LOW (ref 3.5–5.0)
Alkaline Phosphatase: 50 U/L (ref 38–126)
Anion gap: 7 (ref 5–15)
BUN: 13 mg/dL (ref 6–20)
CO2: 24 mmol/L (ref 22–32)
Calcium: 8.6 mg/dL — ABNORMAL LOW (ref 8.9–10.3)
Chloride: 105 mmol/L (ref 98–111)
Creatinine, Ser: 0.84 mg/dL (ref 0.44–1.00)
GFR, Estimated: >60 mL/min (ref >60)
Glucose, Bld: 97 mg/dL (ref 70–99)
Potassium: 3.5 mmol/L (ref 3.5–5.1)
Sodium: 136 mmol/L (ref 135–145)
Total Bilirubin: 0.5 mg/dL (ref 0.3–1.2)
Total Protein: 6.7 g/dL (ref 6.5–8.1)

## 2021-10-08 LAB — URINALYSIS, ROUTINE W REFLEX MICROSCOPIC
Bilirubin Urine: NEGATIVE
Glucose, UA: NEGATIVE mg/dL
Hgb urine dipstick: NEGATIVE
Ketones, ur: NEGATIVE mg/dL
Nitrite: NEGATIVE
Protein, ur: NEGATIVE mg/dL
Specific Gravity, Urine: 1.015 (ref 1.005–1.030)
pH: 7 (ref 5.0–8.0)

## 2021-10-08 LAB — PREGNANCY, URINE: Preg Test, Ur: NEGATIVE

## 2021-10-08 LAB — LIPASE, BLOOD: Lipase: 27 U/L (ref 11–51)

## 2021-10-08 MED ORDER — HYDROCORTISONE (PERIANAL) 2.5 % EX CREA: 1.0000 | TOPICAL_CREAM | Freq: Two times a day (BID) | CUTANEOUS | 0 refills | Status: DC

## 2021-10-08 NOTE — Discharge Instructions (Signed)
Please continue to follow with your PCP and return with any new or suddenly worsening symptoms.

## 2021-10-08 NOTE — ED Provider Notes (Signed)
Emergency Department Provider Note   I have reviewed the triage vital signs and the nursing notes.   HISTORY  Chief Complaint Palpitations   HPI Rhonda Barron is a 36 y.o. female past history of anemia and elevated BMI presents emergency department with heart palpitations.  Initially she thought that she may have the symptoms related to phentermine which she was taking for weight loss.  She was having heart palpitations and the dose was reduced and ultimately medication was discontinued 2 months ago.  She continues to have intermittent heart palpitations which occasional CP but nothing recent. No SOB.  She states she has some associated cramping in her legs and occasionally in her lower abdomen which improves with movement and stretching.  No vaginal bleeding or discharge.  No UTI symptoms.  She also notes a prior hemorrhoid which occasionally bleeds. No rectal pain. Notes some itching.    Past Medical History:  Diagnosis Date   Abnormal Pap smear    Anemia    History of chlamydia    Morbid obesity ([place])    Vaginal Pap smear, abnormal     Review of Systems  Constitutional: No fever/chills Cardiovascular: Denies chest pain. Positive palpitations.  Respiratory: Denies shortness of breath. Gastrointestinal: Intermittent abdominal pain.   Musculoskeletal: Negative for back pain. Skin: Negative for rash. Neurological: Negative for headaches, focal weakness or numbness.  ____________________________________________   PHYSICAL EXAM:  VITAL SIGNS: ED Triage Vitals  Enc Vitals Group     BP 10/08/21 1352 (!) 143/88     Pulse Rate 10/08/21 1352 66     Resp 10/08/21 1352 20     Temp 10/08/21 1352 98.7 F (37.1 C)     Temp Source 10/08/21 1352 Oral     SpO2 10/08/21 1352 100 %     Weight 10/08/21 1357 278 lb (126.1 kg)     Height 10/08/21 1357 '5\' 7"'$  (1.702 m)   Constitutional: Alert and oriented. Well appearing and in no acute distress. Eyes: Conjunctivae are normal.   Head: Atraumatic. Nose: No congestion/rhinnorhea. Mouth/Throat: Mucous membranes are moist.  Neck: No stridor.   Cardiovascular: Normal rate, regular rhythm. Good peripheral circulation. Grossly normal heart sounds.   Respiratory: Normal respiratory effort.  No retractions. Lungs CTAB. Gastrointestinal: Soft and nontender. No distention.  Musculoskeletal: No lower extremity tenderness nor edema. No gross deformities of extremities. Neurologic:  Normal speech and language. No gross focal neurologic deficits are appreciated.  Skin:  Skin is warm, dry and intact. No rash noted.  ____________________________________________   LABS (all labs ordered are listed, but only abnormal results are displayed)  Labs Reviewed  COMPREHENSIVE METABOLIC PANEL - Abnormal; Notable for the following components:      Result Value   Calcium 8.6 (*)    Albumin 3.4 (*)    All other components within normal limits  URINALYSIS, ROUTINE W REFLEX MICROSCOPIC - Abnormal; Notable for the following components:   Leukocytes,Ua TRACE (*)    All other components within normal limits  URINALYSIS, MICROSCOPIC (REFLEX) - Abnormal; Notable for the following components:   Bacteria, UA RARE (*)    All other components within normal limits  LIPASE, BLOOD  CBC WITH DIFFERENTIAL/PLATELET  PREGNANCY, URINE   ____________________________________________  EKG   EKG Interpretation  Date/Time:  Tuesday October 08 2021 14:03:49 EDT Ventricular Rate:  72 PR Interval:  146 QRS Duration: 88 QT Interval:  396 QTC Calculation: 433 R Axis:   52 Text Interpretation: Normal sinus rhythm Nonspecific ST abnormality  Abnormal ECG When compared with ECG of 12-Dec-2007 18:28, PREVIOUS ECG IS PRESENT Confirmed by Nanda Quinton [phone]) on 10/08/2021 3:01:20 PM       ____________________________________________   PROCEDURES  Procedure(s) performed:   Procedures  None   ____________________________________________   INITIAL IMPRESSION / ASSESSMENT AND PLAN / ED COURSE  Pertinent labs & imaging results that were available during my care of the patient were reviewed by me and considered in my medical decision making (see chart for details).   This patient is Presenting for Evaluation of palpitations, which does require a range of treatment options, and is a complaint that involves a high risk of morbidity and mortality.  The Differential Diagnoses include arrhythmia, electrolyte disturbance, anemia, acute intra-abdominal process, etc.    Clinical Laboratory Tests Ordered, included pregnancy negative.  No UTI.  No acute kidney injury or electrolyte disturbance.  No anemia.  Cardiac Monitor Tracing which shows NSR.    Social Determinants of Health Risk no smoking history.   Medical Decision Making: Summary:  Patient presents to the emergency department for evaluation of heart palpitations.  Normal sinus rhythm on EKG.  Has been off of her stimulant medication for weight loss for 2 months and so doubt this is contributing.  Will obtain screening electrolytes, anemia, etc.   Reevaluation with update and discussion with patient.  She had a ambulatory heart monitor placed today.  She will continue to follow with the results of this with her PCP and return in 2 weeks to her PCP for repeat blood work.  Discussed hydration and will send prescription for Anusol to the pharmacy with hemorrhoid recurrence.   Disposition: discharge  ____________________________________________  FINAL CLINICAL IMPRESSION(S) / ED DIAGNOSES  Final diagnoses:  Palpitations     NEW OUTPATIENT MEDICATIONS STARTED DURING THIS VISIT:  New Prescriptions   HYDROCORTISONE (ANUSOL-HC) 2.5 % RECTAL CREAM    Place 1 Application rectally 2 (two) times daily.    Note:  This document was prepared using Dragon voice recognition software and may include unintentional dictation  errors.  Nanda Quinton, MD, [facility] Emergency Medicine    Jawara Latorre, [facility], MD 10/08/21 [phone]

## 2021-10-08 NOTE — ED Triage Notes (Signed)
Pt reports palpitations and fatigue x months; also reports rectal "bump" x 1 yr that bleeds

## 2022-01-15 ENCOUNTER — Encounter (INDEPENDENT_AMBULATORY_CARE_PROVIDER_SITE_OTHER): Payer: Medicaid Other | Admitting: Family Medicine

## 2022-01-23 DIAGNOSIS — Z0289 Encounter for other administrative examinations: Secondary | ICD-10-CM

## 2022-02-12 ENCOUNTER — Ambulatory Visit (INDEPENDENT_AMBULATORY_CARE_PROVIDER_SITE_OTHER): Payer: Medicaid Other | Admitting: Family Medicine

## 2022-02-12 ENCOUNTER — Encounter (INDEPENDENT_AMBULATORY_CARE_PROVIDER_SITE_OTHER): Payer: Self-pay | Admitting: Family Medicine

## 2022-02-12 VITALS — BP 115/70 | HR 63 | Temp 98.3°F | Ht 67.0 in

## 2022-02-12 DIAGNOSIS — F321 Major depressive disorder, single episode, moderate: Secondary | ICD-10-CM | POA: Diagnosis not present

## 2022-02-12 DIAGNOSIS — E669 Obesity, unspecified: Secondary | ICD-10-CM

## 2022-02-12 DIAGNOSIS — R7989 Other specified abnormal findings of blood chemistry: Secondary | ICD-10-CM | POA: Diagnosis not present

## 2022-02-12 DIAGNOSIS — R0602 Shortness of breath: Secondary | ICD-10-CM

## 2022-02-12 DIAGNOSIS — R5383 Other fatigue: Secondary | ICD-10-CM

## 2022-02-12 DIAGNOSIS — E559 Vitamin D deficiency, unspecified: Secondary | ICD-10-CM

## 2022-02-12 DIAGNOSIS — Z6841 Body Mass Index (BMI) 40.0 and over, adult: Secondary | ICD-10-CM | POA: Diagnosis not present

## 2022-02-13 LAB — LIPID PANEL WITH LDL/HDL RATIO
Cholesterol, Total: 171 mg/dL (ref 100–199)
HDL: 59 mg/dL (ref >39)
LDL Chol Calc (NIH): 105 mg/dL — ABNORMAL HIGH (ref 0–99)
LDL/HDL Ratio: 1.8 ratio (ref 0.0–3.2)
Triglycerides: 30 mg/dL (ref 0–149)
VLDL Cholesterol Cal: 7 mg/dL (ref 5–40)

## 2022-02-13 LAB — CBC WITH DIFFERENTIAL/PLATELET
Basophils Absolute: 0 10*3/uL (ref 0.0–0.2)
Basos: 1 %
EOS (ABSOLUTE): 0.1 10*3/uL (ref 0.0–0.4)
Eos: 2 %
Hematocrit: 38 % (ref 34.0–46.6)
Hemoglobin: 12.5 g/dL (ref 11.1–15.9)
Immature Grans (Abs): 0 10*3/uL (ref 0.0–0.1)
Immature Granulocytes: 0 %
Lymphocytes Absolute: 1.7 10*3/uL (ref 0.7–3.1)
Lymphs: 32 %
MCH: 29.9 pg (ref 26.6–33.0)
MCHC: 32.9 g/dL (ref 31.5–35.7)
MCV: 91 fL (ref 79–97)
Monocytes Absolute: 0.5 10*3/uL (ref 0.1–0.9)
Monocytes: 9 %
Neutrophils Absolute: 3 10*3/uL (ref 1.4–7.0)
Neutrophils: 56 %
Platelets: 263 10*3/uL (ref 150–450)
RBC: 4.18 x10E6/uL (ref 3.77–5.28)
RDW: 12.9 % (ref 11.7–15.4)
WBC: 5.4 10*3/uL (ref 3.4–10.8)

## 2022-02-13 LAB — T4, FREE: Free T4: 1.2 ng/dL (ref 0.82–1.77)

## 2022-02-13 LAB — COMPREHENSIVE METABOLIC PANEL
ALT: 15 IU/L (ref 0–32)
AST: 20 IU/L (ref 0–40)
Albumin/Globulin Ratio: 1.6 (ref 1.2–2.2)
Albumin: 3.9 g/dL (ref 3.9–4.9)
Alkaline Phosphatase: 54 IU/L (ref 44–121)
BUN/Creatinine Ratio: 19 (ref 9–23)
BUN: 14 mg/dL (ref 6–20)
Bilirubin Total: 0.3 mg/dL (ref 0.0–1.2)
CO2: 22 mmol/L (ref 20–29)
Calcium: 8.7 mg/dL (ref 8.7–10.2)
Chloride: 104 mmol/L (ref 96–106)
Creatinine, Ser: 0.73 mg/dL (ref 0.57–1.00)
Globulin, Total: 2.5 g/dL (ref 1.5–4.5)
Glucose: 94 mg/dL (ref 70–99)
Potassium: 4 mmol/L (ref 3.5–5.2)
Sodium: 138 mmol/L (ref 134–144)
Total Protein: 6.4 g/dL (ref 6.0–8.5)
eGFR: 110 mL/min/{1.73_m2} (ref >59)

## 2022-02-13 LAB — INSULIN, RANDOM: INSULIN: 8.3 u[IU]/mL (ref 2.6–24.9)

## 2022-02-13 LAB — TSH: TSH: 1.16 u[IU]/mL (ref 0.450–4.500)

## 2022-02-13 LAB — HEMOGLOBIN A1C
Est. average glucose Bld gHb Est-mCnc: 105 mg/dL
Hgb A1c MFr Bld: 5.3 % (ref 4.8–5.6)

## 2022-02-13 LAB — VITAMIN D 25 HYDROXY (VIT D DEFICIENCY, FRACTURES): Vit D, 25-Hydroxy: 32.7 ng/mL (ref 30.0–100.0)

## 2022-02-13 LAB — T3: T3, Total: 108 ng/dL (ref 71–180)

## 2022-02-24 NOTE — Progress Notes (Signed)
Chief Complaint:   Rhonda Barron (MR# [id number]) is a 37 y.o. female who presents for evaluation and treatment of Rhonda and related comorbidities. Current BMI is Body mass index is 43.54 kg/m. Rhonda Barron has been struggling with her weight for many years and has been unsuccessful in either losing weight, maintaining weight loss, or reaching her healthy weight goal.  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  Lives at home with her 3 sons ages 63, 96, 2.  Started gaining around 1st pregnancy.  Can skip lunch or breakfast.  Coffee with 1/3 cup of creamer, 1/2 cup of Almond milk with frozen berries+banana+ 1 tbsp (1/3 of total) or 1.5 cup oatmeal (feel full).  At 12:30 pm, grilled chicken salad or wings, 7-8 (satisfied).  7 pm-ground [place] tacos or [place] meatballs, (6-7) and broccoli (satisfied).  Rhonda Barron is currently in the action stage of change and ready to dedicate time achieving and maintaining a healthier weight. Rhonda Barron is interested in becoming our patient and working on intensive lifestyle modifications including (but not limited to) diet and exercise for weight loss.  Rhonda Barron's habits were reviewed today and are as follows: Her family eats meals together, she thinks her family will eat healthier with her, her desired weight loss is 80 pounds, she started gaining weight after 1st child, her heaviest weight ever was 280 pounds, she has significant food cravings issues, she skips meals frequently, she is frequently drinking liquids with calories, she frequently makes poor food choices, and she frequently eats larger portions than normal.  Depression Screen Rhonda Barron's Food and Mood (modified PHQ-9) score was 13.     12/22/2014    3:55 PM  Depression screen PHQ 2/9  Decreased Interest 0  Down, Depressed, Hopeless 0  PHQ - 2 Score 0   Subjective:   1. Other fatigue Rhonda Barron admits to daytime somnolence and reports waking up still  tired. Patient has a history of symptoms of daytime fatigue and morning fatigue. Rhonda Barron generally gets 5 or 6 hours of sleep per night, and states that she has difficulty falling asleep and difficulty falling back asleep if awakened. Snoring is present. Apneic episodes are not present. Epworth Sleepiness Score is 16.    2. SOBOE (shortness of breath on exertion) Rhonda Barron notes increasing shortness of breath with exercising and seems to be worsening over time with weight gain. She notes getting out of breath sooner with activity than she used to. This has not gotten worse recently. Rhonda Barron denies shortness of breath at rest or orthopnea.   3. Vitamin D deficiency Not on over the counter multivitamin.  Denies any nausea, vomiting or muscle weakness.  She notes fatigue.  4. Elevated serum creatinine Historical elevation.  Family history of kidney disease.  5. Depression, major, single episode, moderate ([name]) Grief over father's death from 04-16-2019.  Denies suicidal ideas, and homicidal ideas.  Not on medications.  Assessment/Plan:   1. Other fatigue We will obtain labs today.  Rhonda Barron does feel that her weight is causing her energy to be lower than it should be. Fatigue may be related to Rhonda, depression or many other causes. Labs will be ordered, and in the meanwhile, Rhonda Barron will focus on self care including making healthy food choices, increasing physical activity and focusing on stress reduction.   - Insulin, random - Hemoglobin A1c - TSH - T4, free - T3  2. SOBOE (shortness of breath on exertion) We will obtain labs today.  Rhonda Barron does feel that she gets out of breath more easily that she used to when she exercises. Rhonda Barron's shortness of breath appears to be Rhonda related and exercise induced. She has agreed to work on weight loss and gradually increase exercise to treat her exercise induced shortness of breath. Will continue to monitor closely.   - CBC with Differential/Platelet -  Lipid Panel With LDL/HDL Ratio  3. Vitamin D deficiency We will obtain labs today.  - VITAMIN D 25 Hydroxy (Vit-D Deficiency, Fractures)  4. Elevated serum creatinine We will obtain labs today.  - Comprehensive metabolic panel  5. Depression, major, single episode, moderate (Rhonda Barron) Rhonda Barron had a positive depression screening. Depression is commonly associated with Rhonda and often results in emotional eating behaviors. We will monitor this closely and work on CBT to help improve the non-hunger eating patterns. Referral to Psychology may be required if no improvement is seen as she continues in our clinic.   6. BMI 40.0-44.9, adult (Rhonda Barron)  7. Rhonda with starting BMI of 43.7 Rhonda Barron is currently in the action stage of change and her goal is to continue with weight loss efforts. I recommend Rhonda Barron begin the structured treatment plan as follows:  She has agreed to the Category 3 Plan.  Exercise goals: No exercise has been prescribed at this time.   Behavioral modification strategies: increasing lean protein intake, no skipping meals, meal planning and cooking strategies, keeping healthy foods in the home, and planning for success.  She was informed of the importance of frequent follow-up visits to maximize her success with intensive lifestyle modifications for her multiple health conditions. She was informed we would discuss her lab results at her next visit unless there is a critical issue that needs to be addressed sooner. Rhonda Barron agreed to keep her next visit at the agreed upon time to discuss these results.  Objective:   Blood pressure 115/70, pulse 63, temperature 98.3 F (36.8 C), height '5\' 7"'$  (1.702 m), last menstrual period 12/30/2021, SpO2 99 %. Body mass index is 43.54 kg/m.  EKG: Normal sinus rhythm, rate 72 bpm on 10/08/21.[name] Calorimeter completed today shows a VO2 of 273 ml and a REE of 1186.  Her calculated basal metabolic rate is 123456 thus her basal metabolic  rate is worse than expected.  General: Cooperative, alert, well developed, in no acute distress. HEENT: Conjunctivae and lids unremarkable. Cardiovascular: Regular rhythm.  Lungs: Normal work of breathing. Neurologic: No focal deficits.   Lab Results  Component Value Date   CREATININE 0.73 02/12/2022   BUN 14 02/12/2022   NA 138 02/12/2022   K 4.0 02/12/2022   CL 104 02/12/2022   CO2 22 02/12/2022   Lab Results  Component Value Date   ALT 15 02/12/2022   AST 20 02/12/2022   ALKPHOS 54 02/12/2022   BILITOT 0.3 02/12/2022   Lab Results  Component Value Date   HGBA1C 5.3 02/12/2022   Lab Results  Component Value Date   INSULIN 8.3 02/12/2022   Lab Results  Component Value Date   TSH 1.160 02/12/2022   Lab Results  Component Value Date   CHOL 171 02/12/2022   HDL 59 02/12/2022   LDLCALC 105 (H) 02/12/2022   TRIG 30 02/12/2022   Lab Results  Component Value Date   WBC 5.4 02/12/2022   HGB 12.5 02/12/2022   HCT 38.0 02/12/2022   MCV 91 02/12/2022   PLT 263 02/12/2022   No results found for: "IRON", "TIBC", "FERRITIN"  Attestation  Statements:   Reviewed by clinician on day of visit: allergies, medications, problem list, medical history, surgical history, family history, social history, and previous encounter notes.  Time spent on visit including pre-visit chart review and post-visit charting and care was 45 minutes.   I, Rhonda Barron, RMA am acting as transcriptionist for Rhonda Common, MD.  This is the patient's first visit at [facility]. The patient's NEW PATIENT PACKET was reviewed at length. Included in the packet: current and past health history, medications, allergies, ROS, gynecologic history (women only), surgical history, family history, social history, weight history, weight loss surgery history (for those that have had weight loss surgery), nutritional evaluation, mood and food questionnaire, PHQ9, Epworth questionnaire, sleep  habits questionnaire, patient life and health improvement goals questionnaire. These will all be scanned into the patient's chart under media.   During the visit, I independently reviewed the patient's EKG, bioimpedance scale results, and indirect calorimeter results. I used this information to tailor a meal plan for the patient that will help her to lose weight and will improve her Rhonda-related conditions going forward. I performed a medically necessary appropriate examination and/or evaluation. I discussed the assessment and treatment plan with the patient. The patient was provided an opportunity to ask questions and all were answered. The patient agreed with the plan and demonstrated an understanding of the instructions. Labs were ordered at this visit and will be reviewed at the next visit unless more critical results need to be addressed immediately. Clinical information was updated and documented in the EMR.    I have reviewed the above documentation for accuracy and completeness, and I agree with the above. - Rhonda Common, MD

## 2022-02-25 ENCOUNTER — Encounter (INDEPENDENT_AMBULATORY_CARE_PROVIDER_SITE_OTHER): Payer: Self-pay | Admitting: Family Medicine

## 2022-02-25 ENCOUNTER — Ambulatory Visit (INDEPENDENT_AMBULATORY_CARE_PROVIDER_SITE_OTHER): Payer: Medicaid Other | Admitting: Family Medicine

## 2022-02-25 VITALS — BP 124/73 | HR 61 | Temp 98.3°F | Ht 67.0 in

## 2022-02-25 DIAGNOSIS — E7849 Other hyperlipidemia: Secondary | ICD-10-CM

## 2022-02-25 DIAGNOSIS — E559 Vitamin D deficiency, unspecified: Secondary | ICD-10-CM | POA: Diagnosis not present

## 2022-02-25 DIAGNOSIS — E88819 Insulin resistance, unspecified: Secondary | ICD-10-CM | POA: Diagnosis not present

## 2022-02-25 DIAGNOSIS — E669 Obesity, unspecified: Secondary | ICD-10-CM | POA: Diagnosis not present

## 2022-02-25 DIAGNOSIS — Z6841 Body Mass Index (BMI) 40.0 and over, adult: Secondary | ICD-10-CM

## 2022-02-25 MED ORDER — VITAMIN D (ERGOCALCIFEROL) 1.25 MG (50000 UNIT) PO CAPS: 50000.0000 [IU] | ORAL_CAPSULE | ORAL | 0 refills | Status: AC

## 2022-02-25 NOTE — Progress Notes (Deleted)
Found the first two weeks very difficult; food wasn't in the house and this was food selections that she wasn't used to eating.  Over the last few weeks she has been able to recognize she needed to incorporate more protein after weighing the meat.  Since the last appointment she has done boiled eggs, 2% cottage cheese and portioned out the [place].  Was incorporating lean cuisine meals in consistently.  Thinks that perhaps being more mindful over the next few weeks will be the goal.  No upcoming activities or vacation planned.  Only incorporated vegetables she knew she liked.  Occasionally skipping meals.  Sons like all the meals on the plan.

## 2022-03-10 NOTE — Progress Notes (Signed)
Chief Complaint:   OBESITY Rhonda Barron is here to discuss her progress with her obesity treatment plan along with follow-up of her obesity related diagnoses. Rhonda Barron is on the Category 3 Plan and states she is following her eating plan approximately ?% of the time. Rhonda Barron states she is exercising 0 minutes 0 times per week.  Today's visit was #: 2 Starting weight: 279 lbs Starting date: 02/12/2022 Today's weight: 278 lbs Today's date: 02/25/2022 Total lbs lost to date: 1 lb Total lbs lost since last in-office visit: 1  Interim History: Found the first two weeks very difficult; food wasn't in the house and this was food selections that Rhonda Barron wasn't used to eating.  Over the last few weeks she has been able to recognize she needed to incorporate more protein after weighing the meat.  Since the last appointment she has done boiled eggs, 2% cottage cheese and portioned out the [place].  Was incorporating lean cuisine meals in consistently.  Thinks that perhaps being more mindful over the next few weeks will be the goal.  No upcoming activities or vacation planned.  Only incorporated vegetables she knew she liked.  Occasionally skipping meals.  Sons like all the meals on the plan.   Subjective:   1. Vitamin D deficiency Labs discussed during visit today.  Not on Vit D.  Notes fatigue.  Vit D level of 32.7.  2. Other hyperlipidemia Labs discussed during visit today.  LDL of 105, HDL of 59, Trig of 30.  Not on medications.  Age excludes risk stratification.  3. Insulin resistance A1c at 5.3, insulin at 8.3 (only minimally elevated).  Not on medications.  Assessment/Plan:   1. Vitamin D deficiency Will Start Vit D 50K IU weekly for 1 month with 0 refills.  -Start Vitamin D, Ergocalciferol, (DRISDOL) 1.25 MG (50000 UNIT) CAPS capsule; Take 1 capsule (50,000 Units total) by mouth every 7 (seven) days.  Dispense: 4 capsule; Refill: 0  2. Other hyperlipidemia Will repeat labs in 3  months.  3. Insulin resistance Discussed carb intake control and importance of protein before carbs.  Patient mentions she did not follow meal plan much so will recommit and we will retest labs in 3 months.  4. BMI 40.0-44.9, adult ([place])  5. Obesity with starting BMI of 43.7 Rhonda Barron is currently in the action stage of change. As such, her goal is to continue with weight loss efforts. She has agreed to the Category 3 Plan.   Exercise goals: No exercise has been prescribed at this time.  Behavioral modification strategies: increasing lean protein intake, meal planning and cooking strategies, keeping healthy foods in the home, and planning for success.  Rhonda Barron has agreed to follow-up with our clinic in 2 weeks. She was informed of the importance of frequent follow-up visits to maximize her success with intensive lifestyle modifications for her multiple health conditions.   Objective:   Blood pressure 124/73, pulse 61, temperature 98.3 F (36.8 C), height '5\' 7"'$  (1.702 m), last menstrual period 12/30/2021, SpO2 100 %. Body mass index is 43.54 kg/m.  General: Cooperative, alert, well developed, in no acute distress. HEENT: Conjunctivae and lids unremarkable. Cardiovascular: Regular rhythm.  Lungs: Normal work of breathing. Neurologic: No focal deficits.   Lab Results  Component Value Date   CREATININE 0.73 02/12/2022   BUN 14 02/12/2022   NA 138 02/12/2022   K 4.0 02/12/2022   CL 104 02/12/2022   CO2 22 02/12/2022   Lab Results  Component Value  Date   ALT 15 02/12/2022   AST 20 02/12/2022   ALKPHOS 54 02/12/2022   BILITOT 0.3 02/12/2022   Lab Results  Component Value Date   HGBA1C 5.3 02/12/2022   Lab Results  Component Value Date   INSULIN 8.3 02/12/2022   Lab Results  Component Value Date   TSH 1.160 02/12/2022   Lab Results  Component Value Date   CHOL 171 02/12/2022   HDL 59 02/12/2022   LDLCALC 105 (H) 02/12/2022   TRIG 30 02/12/2022   Lab Results   Component Value Date   VD25OH 32.7 02/12/2022   Lab Results  Component Value Date   WBC 5.4 02/12/2022   HGB 12.5 02/12/2022   HCT 38.0 02/12/2022   MCV 91 02/12/2022   PLT 263 02/12/2022   No results found for: "IRON", "TIBC", "FERRITIN"  Attestation Statements:   Reviewed by clinician on day of visit: allergies, medications, problem list, medical history, surgical history, family history, social history, and previous encounter notes.  I, Elnora Morrison, RMA am acting as transcriptionist for Coralie Common, MD.  I have reviewed the above documentation for accuracy and completeness, and I agree with the above. - Coralie Common, MD

## 2022-03-11 ENCOUNTER — Encounter (INDEPENDENT_AMBULATORY_CARE_PROVIDER_SITE_OTHER): Payer: Self-pay | Admitting: Physician Assistant

## 2022-03-11 ENCOUNTER — Ambulatory Visit (INDEPENDENT_AMBULATORY_CARE_PROVIDER_SITE_OTHER): Payer: Medicaid Other | Admitting: Physician Assistant

## 2022-03-11 VITALS — BP 121/75 | HR 68 | Temp 98.8°F | Ht 67.0 in | Wt 279.0 lb

## 2022-03-11 DIAGNOSIS — E88819 Insulin resistance, unspecified: Secondary | ICD-10-CM | POA: Diagnosis not present

## 2022-03-11 DIAGNOSIS — Z6841 Body Mass Index (BMI) 40.0 and over, adult: Secondary | ICD-10-CM | POA: Diagnosis not present

## 2022-03-11 DIAGNOSIS — E559 Vitamin D deficiency, unspecified: Secondary | ICD-10-CM | POA: Insufficient documentation

## 2022-03-11 NOTE — Progress Notes (Signed)
Chief Complaint:   OBESITY Pascale is here to discuss her progress with her obesity treatment plan along with follow-up of her obesity related diagnoses. Loretto is on the Category 3 Plan and states she is following her eating plan approximately 40% of the time. Lekha states she is not exercising regularly 0 minutes 0 times per week.  Today's visit was #: 3 Starting weight: 279 lbs Starting date: 02/12/2022 Today's weight: 279 lbs Today's date: 03/11/2022 Total lbs lost to date: 0 Total lbs lost since last in-office visit: + 1 lb  Interim History: Dmiyah has been doing better with meal planning and prepping.  She is using ground [place] and chicken and also some greek yogurt and string cheese to help to meet protein goals.  She feels she is still over eating/hungry and wants to know if there are medications to help with hunger. We discussed the importance of adequate protein intake and how this should help decrease her hunger and discussed strategies for getting more protein needs met during the day.   She has 3  children (11, 7 and 2 yr old boys) and working full time at a high school.  She is reading labels on her food, realizing hidden/excess sugars.  She is not exercising regularly, but does report doing some strengthening type exercises.   Bio impedence scale showed: Muscle mass increased 2.4 lbs     Adipose mass decreased 1.8 lbs  Subjective:   1. Insulin resistance A1C 5.3 - at goal. Insulin 8.3- not at goal. She is working on decreasing simple carbohydrates, increasing lean protein and exercise to promote weight loss and improve insulin resistance.   2. Vitamin D deficiency Vitamin D Deficiency Vitamin D is not at goal of 50.  Most recent vitamin D level was 32.7. No side effects with Ergocalciferol. She is on  prescription ergocalciferol 50,000 IU weekly. Lab Results  Component Value Date   VD25OH 32.7 02/12/2022    Plan: Continue prescription vitamin D 50,000  IU weekly.   3. BMI 40.0-44.9, adult ([place]), Current BMI 43.7  4. Obesity (HCC)-Start BMI 43.7  Assessment/Plan:   1. Insulin resistance Could consider metformin for IR in future. Continue working to decrease simple carbohydrates, increase lean protein and exercise to promote weight loss and improve insulin resistance.   2. Vitamin D deficiency Continue Ergocalciferol once weekly. Recheck vit D level at least 2-3 times yearly to avoid over supplementation.   3. BMI 40.0-44.9, adult ([place]), Current BMI 43.7  4. Obesity (HCC)-Start BMI 43.7   Shunte is currently in the action stage of change. As such, her goal is to continue with weight loss efforts. She has agreed to the Category 3 Plan.   Exercise goals: All adults should avoid inactivity. Some physical activity is better than none, and adults who participate in any amount of physical activity gain some health benefits.  Behavioral modification strategies: increasing lean protein intake, decreasing simple carbohydrates, meal planning and cooking strategies, keeping healthy foods in the home, and avoiding temptations.  Tyjai has agreed to follow-up with our clinic in 2 weeks. She was informed of the importance of frequent follow-up visits to maximize her success with intensive lifestyle modifications for her multiple health conditions.     Objective:   Blood pressure 121/75, pulse 68, temperature 98.8 F (37.1 C), height '5\' 7"'$  (1.702 m), weight 279 lb (126.6 kg), last menstrual period 12/30/2021, SpO2 100 %. Body mass index is 43.7 kg/m.  General: Cooperative, alert, well developed,  in no acute distress. HEENT: Conjunctivae and lids unremarkable. Cardiovascular: Regular rhythm.  Lungs: Normal work of breathing. Neurologic: No focal deficits.   Lab Results  Component Value Date   CREATININE 0.73 02/12/2022   BUN 14 02/12/2022   NA 138 02/12/2022   K 4.0 02/12/2022   CL 104 02/12/2022   CO2 22 02/12/2022   Lab  Results  Component Value Date   ALT 15 02/12/2022   AST 20 02/12/2022   ALKPHOS 54 02/12/2022   BILITOT 0.3 02/12/2022   Lab Results  Component Value Date   HGBA1C 5.3 02/12/2022   Lab Results  Component Value Date   INSULIN 8.3 02/12/2022   Lab Results  Component Value Date   TSH 1.160 02/12/2022   Lab Results  Component Value Date   CHOL 171 02/12/2022   HDL 59 02/12/2022   LDLCALC 105 (H) 02/12/2022   TRIG 30 02/12/2022   Lab Results  Component Value Date   VD25OH 32.7 02/12/2022   Lab Results  Component Value Date   WBC 5.4 02/12/2022   HGB 12.5 02/12/2022   HCT 38.0 02/12/2022   MCV 91 02/12/2022   PLT 263 02/12/2022   No results found for: "IRON", "TIBC", "FERRITIN"  Obesity Behavioral Intervention:   Approximately 15 minutes were spent on the discussion below.  ASK: We discussed the diagnosis of obesity with Jassica today and Shaterra agreed to give [place] permission to discuss obesity behavioral modification therapy today.  ASSESS: Kristopher has the diagnosis of obesity and her BMI today is 43.7. Dannette is in the action stage of change.   ADVISE: Katrisha was educated on the multiple health risks of obesity as well as the benefit of weight loss to improve her health. She was advised of the need for long term treatment and the importance of lifestyle modifications to improve her current health and to decrease her risk of future health problems.  AGREE: Multiple dietary modification options and treatment options were discussed and Fabiola agreed to follow the recommendations documented in the above note.  ARRANGE: Cate was educated on the importance of frequent visits to treat obesity as outlined per CMS and USPSTF guidelines and agreed to schedule her next follow up appointment today.  Attestation Statements:   Reviewed by clinician on day of visit: allergies, medications, problem list, medical history, surgical history, family history, social history,  and previous encounter notes.  Time spent on visit including pre-visit chart review and post-visit care and charting was 49 minutes.   Larnce Schnackenberg,PA-C

## 2022-03-26 ENCOUNTER — Ambulatory Visit (INDEPENDENT_AMBULATORY_CARE_PROVIDER_SITE_OTHER): Payer: Medicaid Other | Admitting: Physician Assistant

## 2022-04-03 ENCOUNTER — Ambulatory Visit (INDEPENDENT_AMBULATORY_CARE_PROVIDER_SITE_OTHER): Payer: Medicaid Other | Admitting: Family Medicine

## 2022-04-07 ENCOUNTER — Other Ambulatory Visit (INDEPENDENT_AMBULATORY_CARE_PROVIDER_SITE_OTHER): Payer: Self-pay | Admitting: Family Medicine

## 2022-04-07 DIAGNOSIS — E559 Vitamin D deficiency, unspecified: Secondary | ICD-10-CM

## 2022-04-08 ENCOUNTER — Ambulatory Visit (INDEPENDENT_AMBULATORY_CARE_PROVIDER_SITE_OTHER): Payer: BC Managed Care – PPO | Admitting: Physician Assistant

## 2022-04-16 ENCOUNTER — Other Ambulatory Visit: Payer: Self-pay | Admitting: Obstetrics & Gynecology

## 2022-04-28 ENCOUNTER — Other Ambulatory Visit: Payer: Self-pay | Admitting: Obstetrics & Gynecology

## 2022-04-28 DIAGNOSIS — Z9189 Other specified personal risk factors, not elsewhere classified: Secondary | ICD-10-CM

## 2022-05-14 ENCOUNTER — Emergency Department (HOSPITAL_COMMUNITY): Payer: BC Managed Care – PPO

## 2022-05-14 ENCOUNTER — Emergency Department (HOSPITAL_COMMUNITY)
Admission: EM | Admit: 2022-05-14 | Discharge: 2022-05-14 | Disposition: A | Discharge status: Home / Self Care | Payer: BC Managed Care – PPO | Attending: Emergency Medicine | Admitting: Emergency Medicine

## 2022-05-14 ENCOUNTER — Other Ambulatory Visit: Payer: Self-pay

## 2022-05-14 DIAGNOSIS — M25511 Pain in right shoulder: Secondary | ICD-10-CM | POA: Insufficient documentation

## 2022-05-14 DIAGNOSIS — M25532 Pain in left wrist: Secondary | ICD-10-CM | POA: Insufficient documentation

## 2022-05-14 DIAGNOSIS — M25562 Pain in left knee: Secondary | ICD-10-CM | POA: Insufficient documentation

## 2022-05-14 DIAGNOSIS — Y92410 Unspecified street and highway as the place of occurrence of the external cause: Secondary | ICD-10-CM | POA: Insufficient documentation

## 2022-05-14 DIAGNOSIS — M79641 Pain in right hand: Secondary | ICD-10-CM | POA: Insufficient documentation

## 2022-05-14 DIAGNOSIS — M545 Low back pain, unspecified: Secondary | ICD-10-CM | POA: Diagnosis not present

## 2022-05-14 DIAGNOSIS — M542 Cervicalgia: Secondary | ICD-10-CM | POA: Diagnosis not present

## 2022-05-14 DIAGNOSIS — M7918 Myalgia, other site: Secondary | ICD-10-CM

## 2022-05-14 MED ORDER — METHOCARBAMOL 500 MG PO TABS: 1000.0000 mg | ORAL_TABLET | Freq: Four times a day (QID) | ORAL | 0 refills | Status: AC

## 2022-05-14 MED ORDER — HYDROCODONE-ACETAMINOPHEN 5-325 MG PO TABS
1.0000 | ORAL_TABLET | Freq: Once | ORAL | Status: AC
  Administered 2022-05-14: 1 via ORAL
  Filled 2022-05-14: qty 1

## 2022-05-14 MED ORDER — NAPROXEN 500 MG PO TABS: 500.0000 mg | ORAL_TABLET | Freq: Two times a day (BID) | ORAL | 0 refills | Status: DC
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
## 2022-05-24 ENCOUNTER — Other Ambulatory Visit: Payer: BC Managed Care – PPO

## 2022-05-26 ENCOUNTER — Other Ambulatory Visit (INDEPENDENT_AMBULATORY_CARE_PROVIDER_SITE_OTHER): Payer: Self-pay | Admitting: Family Medicine

## 2022-05-26 DIAGNOSIS — E559 Vitamin D deficiency, unspecified: Secondary | ICD-10-CM

## 2022-08-01 ENCOUNTER — Ambulatory Visit (HOSPITAL_COMMUNITY)
Admission: EM | Admit: 2022-08-01 | Discharge: 2022-08-01 | Disposition: A | Discharge status: Home / Self Care | Payer: BC Managed Care – PPO

## 2022-08-01 ENCOUNTER — Encounter (HOSPITAL_COMMUNITY): Payer: Self-pay

## 2022-08-01 ENCOUNTER — Ambulatory Visit (INDEPENDENT_AMBULATORY_CARE_PROVIDER_SITE_OTHER): Payer: BC Managed Care – PPO

## 2022-08-01 DIAGNOSIS — S60222A Contusion of left hand, initial encounter: Secondary | ICD-10-CM

## 2022-08-01 MED ORDER — IBUPROFEN 800 MG PO TABS
800.0000 mg | ORAL_TABLET | Freq: Once | ORAL | Status: AC
  Administered 2022-08-01: 800 mg via ORAL

## 2022-08-01 MED ORDER — NAPROXEN 500 MG PO TABS: 500.0000 mg | ORAL_TABLET | Freq: Two times a day (BID) | ORAL | 0 refills | Status: AC

## 2022-08-01 NOTE — ED Provider Notes (Signed)
[facility]    CSN: [id number] Arrival date & time: 08/01/22  0813      History   Chief Complaint Chief Complaint  Patient presents with   Hand Pain    HPI Rhonda Barron is a 37 y.o. female.   Patient presents to clinic for left hand pain. Last week she was playing football and blocking someone when she heard a pop in her left hand. Since then she has had intermittent pain. She was folding laundry last night when she heard another pop and felt the pain return. No swelling, deformity or bruising.   Denies any falls.   Has been taking OTC medications for pain.   The history is provided by the patient and medical records.  Hand Pain    Past Medical History:  Diagnosis Date   Abnormal Pap smear    Anemia    Anxiety    Back pain    Bilateral swelling of feet    Depression    History of chlamydia    Lactose intolerance    Morbid obesity (HCC)    Palpitations    SOB (shortness of breath)    Vaginal Pap smear, abnormal    Vitamin D deficiency     Patient Active Problem List   Diagnosis Date Noted   Insulin resistance 03/11/2022   Vitamin D deficiency 03/11/2022   BMI 40.0-44.9, adult (HCC) 03/11/2022   Postpartum care following cesarean delivery 10/3 10/17/2019   Rubella nonimmune status, delivered, current hospitalization 10/17/2019   History of cesarean delivery affecting pregnancy 10/16/2019   H/O cesarean section 10/16/2019   Morbid obesity (HCC)    HSV (herpes simplex virus) infection 01/05/2015   Latex allergy 05/15/2014   Inguinal hernia - pediatric w/ laparscopic exam 05/15/2014   Obese 12/23/2010   History of abnormal Pap smear 12/23/2010    Past Surgical History:  Procedure Laterality Date   CESAREAN SECTION N/A 01/05/2015   Procedure: CESAREAN SECTION;  Surgeon: Kirkland Hun, MD;  Location: [facility];  Service: Obstetrics;  Laterality: N/A;   CESAREAN SECTION N/A 10/16/2019   Procedure: CESAREAN SECTION;  Surgeon: Essie Hart,  MD;  Location: [facility];  Service: Obstetrics;  Laterality: N/A;   INGUINAL HERNIA PEDIATRIC WITH LAPAROSCOPIC EXAM     NO PAST SURGERIES     WISDOM TOOTH EXTRACTION     7/23    OB History     Gravida  4   Para  3   Term  3   Preterm  0   AB  1   Living  3      SAB  0   IAB  1   Ectopic  0   Multiple  0   Live Births  3            Home Medications    Prior to Admission medications   Medication Sig Start Date End Date Taking? Authorizing Provider  Multiple Vitamins-Minerals (MULTI + OMEGA-3 ADULT GUMMIES PO) Take by mouth. Smarty Pants-6 gummies per day   Yes [provider]  naproxen (NAPROSYN) 500 MG tablet Take 1 tablet (500 mg total) by mouth 2 (two) times daily. 08/01/22  Yes Rinaldo Ratel, [place], FNP  SLYND 4 MG TABS Take 1 tablet by mouth daily. 06/04/22  Yes [provider]  sulfamethoxazole-trimethoprim (BACTRIM DS) 800-160 MG tablet Take 1 tablet by mouth 2 (two) times daily. 07/25/22  Yes [provider]  cetirizine (ZYRTEC ALLERGY) 10 MG tablet Take 1  tablet (10 mg total) by mouth daily. 10/23/20   Wallis Bamberg, PA-C  methocarbamol (ROBAXIN) 500 MG tablet Take 2 tablets (1,000 mg total) by mouth 4 (four) times daily. 05/14/22   Renne Crigler, PA-C  Omega-3 Fatty Acids (OMEGA-3 FISH OIL PO) Take by mouth.    [provider]  Vitamin D, Ergocalciferol, (DRISDOL) 1.25 MG (50000 UNIT) CAPS capsule Take 1 capsule (50,000 Units total) by mouth every 7 (seven) days. 02/25/22   Langston Reusing, MD    Family History Family History  Problem Relation Age of Onset   Stroke Mother    Healthy Mother    Sudden death Mother    Hyperlipidemia Father    Hypertension Father    Diabetes Father    Heart disease Father    Asthma Brother    Stroke Paternal Uncle    Anesthesia problems Neg Hx    Hypotension Neg Hx    Pseudochol deficiency Neg Hx    Malignant hyperthermia Neg Hx     Social History Social History   Tobacco  Use   Smoking status: Never   Smokeless tobacco: Never  Vaping Use   Vaping status: Never Used  Substance Use Topics   Alcohol use: Yes    Comment: occ   Drug use: No     Allergies   Latex   Review of Systems Review of Systems   Physical Exam Triage Vital Signs ED Triage Vitals [08/01/22 0855]  Encounter Vitals Group     BP 122/81     Systolic BP Percentile      Diastolic BP Percentile      Pulse Rate 71     Resp 16     Temp 98.5 F (36.9 C)     Temp Source Oral     SpO2 98 %     Weight      Height      Head Circumference      Peak Flow      Pain Score      Pain Loc      Pain Education      Exclude from Growth Chart    No data found.  Updated Vital Signs BP 122/81 (BP Location: Left Arm)   Pulse 71   Temp 98.5 F (36.9 C) (Oral)   Resp 16   LMP 07/24/2022 (Approximate)   SpO2 98%   Visual Acuity Right Eye Distance:   Left Eye Distance:   Bilateral Distance:    Right Eye Near:   Left Eye Near:    Bilateral Near:     Physical Exam Vitals and nursing note reviewed.  Constitutional:      Appearance: Normal appearance.  HENT:     Head: Normocephalic and atraumatic.     Right Ear: External ear normal.     Left Ear: External ear normal.     Nose: Nose normal.     Mouth/Throat:     Mouth: Mucous membranes are moist.  Eyes:     Conjunctiva/sclera: Conjunctivae normal.  Cardiovascular:     Rate and Rhythm: Normal rate.     Pulses: Normal pulses.  Pulmonary:     Effort: Pulmonary effort is normal. No respiratory distress.  Musculoskeletal:        General: Tenderness and signs of injury present. No swelling or deformity. Normal range of motion.     Left hand: Tenderness and bony tenderness present. No swelling, deformity or lacerations. Normal range of motion. Normal strength. Normal sensation. There  is no disruption of two-point discrimination. Normal pulse.       Arms:  Skin:    General: Skin is warm and dry.     Capillary Refill: Capillary  refill takes less than 2 seconds.  Neurological:     General: No focal deficit present.     Mental Status: She is alert.  Psychiatric:        Mood and Affect: Mood normal.        Behavior: Behavior is cooperative.      UC Treatments / Results  Labs (all labs ordered are listed, but only abnormal results are displayed) Labs Reviewed - No data to display  EKG   Radiology DG Hand Complete Left  Result Date: 08/01/2022 CLINICAL DATA:  Left hand pain for 2 days. EXAM: LEFT HAND - COMPLETE 3+ VIEW COMPARISON:  Left wrist radiographs 05/14/2022 FINDINGS: There is no acute fracture or dislocation. Bony alignment is normal. The joint spaces are preserved. The soft tissues are unremarkable. IMPRESSION: Normal hand radiographs. Electronically Signed   By: Lesia Hausen M.D.   On: 08/01/2022 10:05    Procedures Procedures (including critical care time)  Medications Ordered in UC Medications  ibuprofen (ADVIL) tablet 800 mg (800 mg Oral Given 08/01/22 0931)    Initial Impression / Assessment and Plan / UC Course  I have reviewed the triage vital signs and the nursing notes.  Pertinent labs & imaging results that were available during my care of the patient were reviewed by me and considered in my medical decision making (see chart for details).  Vitals and triage reviewed, patient is hemodynamically stable. Left hand with diffuse TTP over 3rd and 4th metacarpal, no obvious deformity, swelling or bruising, no crepitus. Neurovascularly intact. Imaging negative for fx or dislocation. Provided with wrist brace and advised NSAIDs and ortho f/u if needed. POC, f/u care and return precautions given, no questions at this time.      Final Clinical Impressions(s) / UC Diagnoses   Final diagnoses:  Contusion of left hand, initial encounter     Discharge Instructions      Your x-rays did not show any fractures or dislocations. I suspect your have muscle and tendon injury in this hand that are  not visible on x-ray. Please rest, use anti-inflammatories and the brace. If your hand is still hurting you over the next week, please follow-up with an orthopedic.   Return to clinic for any new or urgent symptoms.      ED Prescriptions     Medication Sig Dispense Auth. Provider   naproxen (NAPROSYN) 500 MG tablet Take 1 tablet (500 mg total) by mouth 2 (two) times daily. 30 tablet Brande Uncapher, [place], [place]      PDMP not reviewed this encounter.   Deshauna Cayson, [place] N, [place] 08/01/22 1019

## 2022-08-01 NOTE — ED Triage Notes (Addendum)
Pt presents with left hand pain x 2 days. Pt states she heard a pop while moving her arm.

## 2022-08-01 NOTE — Discharge Instructions (Addendum)
Your x-rays did not show any fractures or dislocations. I suspect your have muscle and tendon injury in this hand that are not visible on x-ray. Please rest, use anti-inflammatories and the brace. If your hand is still hurting you over the next week, please follow-up with an orthopedic.   Return to clinic for any new or urgent symptoms.
